# Patient Record
Sex: Female | Born: 1975 | Race: Black or African American | Hispanic: No | Marital: Single | Smoking: Never smoker
Health system: Southern US, Community
[De-identification: ages and names within clinical notes are randomized; demographics above are authoritative.]

## PROBLEM LIST (undated history)

## (undated) DIAGNOSIS — I10 Essential (primary) hypertension: Secondary | ICD-10-CM

## (undated) DIAGNOSIS — D259 Leiomyoma of uterus, unspecified: Secondary | ICD-10-CM

## (undated) DIAGNOSIS — F32A Depression, unspecified: Secondary | ICD-10-CM

## (undated) DIAGNOSIS — F329 Major depressive disorder, single episode, unspecified: Secondary | ICD-10-CM

## (undated) DIAGNOSIS — D649 Anemia, unspecified: Secondary | ICD-10-CM

## (undated) DIAGNOSIS — K219 Gastro-esophageal reflux disease without esophagitis: Secondary | ICD-10-CM

## (undated) HISTORY — PX: ABDOMINAL HYSTERECTOMY: SHX81

---

## 2004-08-06 ENCOUNTER — Emergency Department: Payer: Self-pay | Admitting: Emergency Medicine

## 2007-01-02 HISTORY — PX: TUBAL LIGATION: SHX77

## 2007-05-08 ENCOUNTER — Ambulatory Visit: Payer: Self-pay | Admitting: Family Medicine

## 2007-06-25 ENCOUNTER — Observation Stay: Payer: Self-pay

## 2007-06-26 ENCOUNTER — Encounter: Payer: Self-pay | Admitting: Maternal & Fetal Medicine

## 2007-07-10 ENCOUNTER — Encounter: Payer: Self-pay | Admitting: Family Medicine

## 2007-08-02 ENCOUNTER — Encounter: Payer: Self-pay | Admitting: Family Medicine

## 2007-08-07 ENCOUNTER — Encounter: Payer: Self-pay | Admitting: Maternal & Fetal Medicine

## 2007-08-29 ENCOUNTER — Ambulatory Visit: Payer: Self-pay | Admitting: Family Medicine

## 2007-09-22 ENCOUNTER — Inpatient Hospital Stay: Payer: Self-pay | Admitting: Obstetrics and Gynecology

## 2007-09-22 ENCOUNTER — Observation Stay: Payer: Self-pay | Admitting: Obstetrics and Gynecology

## 2010-08-07 ENCOUNTER — Emergency Department: Payer: Self-pay | Admitting: Emergency Medicine

## 2011-03-08 ENCOUNTER — Ambulatory Visit: Payer: Self-pay | Admitting: Family Medicine

## 2015-02-22 ENCOUNTER — Other Ambulatory Visit: Payer: Self-pay | Admitting: Family Medicine

## 2015-02-22 DIAGNOSIS — D259 Leiomyoma of uterus, unspecified: Secondary | ICD-10-CM

## 2015-02-25 ENCOUNTER — Ambulatory Visit: Payer: Medicaid Other

## 2015-06-24 ENCOUNTER — Other Ambulatory Visit: Payer: Self-pay | Admitting: Family Medicine

## 2015-06-24 DIAGNOSIS — D259 Leiomyoma of uterus, unspecified: Secondary | ICD-10-CM

## 2015-06-29 ENCOUNTER — Ambulatory Visit
Admission: RE | Admit: 2015-06-29 | Discharge: 2015-06-29 | Disposition: A | Discharge status: Home / Self Care | Payer: Medicaid Other | Source: Ambulatory Visit | Attending: Family Medicine | Admitting: Family Medicine

## 2015-06-29 DIAGNOSIS — N83202 Unspecified ovarian cyst, left side: Secondary | ICD-10-CM | POA: Diagnosis not present

## 2015-06-29 DIAGNOSIS — N852 Hypertrophy of uterus: Secondary | ICD-10-CM | POA: Diagnosis not present

## 2015-06-29 DIAGNOSIS — D259 Leiomyoma of uterus, unspecified: Secondary | ICD-10-CM | POA: Diagnosis present

## 2015-10-26 ENCOUNTER — Encounter
Admission: RE | Admit: 2015-10-26 | Discharge: 2015-10-26 | Disposition: A | Discharge status: Home / Self Care | Payer: Medicaid Other | Source: Ambulatory Visit | Attending: Obstetrics and Gynecology | Admitting: Obstetrics and Gynecology

## 2015-10-26 DIAGNOSIS — I10 Essential (primary) hypertension: Secondary | ICD-10-CM | POA: Diagnosis not present

## 2015-10-26 DIAGNOSIS — I498 Other specified cardiac arrhythmias: Secondary | ICD-10-CM | POA: Insufficient documentation

## 2015-10-26 DIAGNOSIS — Z01818 Encounter for other preprocedural examination: Secondary | ICD-10-CM | POA: Insufficient documentation

## 2015-10-26 HISTORY — DX: Major depressive disorder, single episode, unspecified: F32.9

## 2015-10-26 HISTORY — DX: Gastro-esophageal reflux disease without esophagitis: K21.9

## 2015-10-26 HISTORY — DX: Essential (primary) hypertension: I10

## 2015-10-26 HISTORY — DX: Anemia, unspecified: D64.9

## 2015-10-26 HISTORY — DX: Depression, unspecified: F32.A

## 2015-10-26 LAB — BASIC METABOLIC PANEL
Anion gap: 7 (ref 5–15)
BUN: 14 mg/dL (ref 6–20)
CO2: 30 mmol/L (ref 22–32)
Calcium: 9.5 mg/dL (ref 8.9–10.3)
Chloride: 102 mmol/L (ref 101–111)
Creatinine, Ser: 0.79 mg/dL (ref 0.44–1.00)
GFR calc Af Amer: >60 mL/min (ref >60)
GFR calc non Af Amer: >60 mL/min (ref >60)
Glucose, Bld: 106 mg/dL — ABNORMAL HIGH (ref 65–99)
Potassium: 3.1 mmol/L — ABNORMAL LOW (ref 3.5–5.1)
Sodium: 139 mmol/L (ref 135–145)

## 2015-10-26 LAB — CBC
HCT: 33.7 % — ABNORMAL LOW (ref 35.0–47.0)
Hemoglobin: 11.5 g/dL — ABNORMAL LOW (ref 12.0–16.0)
MCH: 28.1 pg (ref 26.0–34.0)
MCHC: 34.1 g/dL (ref 32.0–36.0)
MCV: 82.3 fL (ref 80.0–100.0)
Platelets: 284 10*3/uL (ref 150–440)
RBC: 4.1 MIL/uL (ref 3.80–5.20)
RDW: 16.9 % — ABNORMAL HIGH (ref 11.5–14.5)
WBC: 6 10*3/uL (ref 3.6–11.0)

## 2015-10-26 LAB — TYPE AND SCREEN
ABO/RH(D): B POS
Antibody Screen: NEGATIVE

## 2015-10-26 NOTE — Patient Instructions (Signed)
  Your procedure is scheduled on: Thursday Nov. 2, 2017. Report to Same Day Surgery. To find out your arrival time please call [phone] between 1PM - 3PM on Wednesday Nov. 1, 2017.  Remember: Instructions that are not followed completely may result in serious medical risk, up to and including death, or upon the discretion of your surgeon and anesthesiologist your surgery may need to be rescheduled.    _x___ 1. Do not eat food or drink liquids after midnight. No gum chewing or hard candies.     ____ 2. No Alcohol for 24 hours before or after surgery.   ____ 3. Bring all medications with you on the day of surgery if instructed.    __x__ 4. Notify your doctor if there is any change in your medical condition     (cold, fever, infections).    _____ 5. No smoking 24 hours prior to surgery.     Do not wear jewelry, make-up, hairpins, clips or nail polish.  Do not wear lotions, powders, or perfumes.   Do not shave 48 hours prior to surgery. Men may shave face and neck.  Do not bring valuables to the hospital.    [facility] is not responsible for any belongings or valuables.               Contacts, dentures or bridgework may not be worn into surgery.  Leave your suitcase in the car. After surgery it may be brought to your room.  For patients admitted to the hospital, discharge time is determined by your treatment team.   Patients discharged the day of surgery will not be allowed to drive home.    Please read over the following fact sheets that you were given:   [facility] Preparing for Surgery  _x___ Take these medicines the morning of surgery with A SIP OF WATER:    1. FLUoxetine (PROZAC)    ____ Fleet Enema (as directed)   __x__ Use CHG Soap as directed on instruction sheet  ____ Use inhalers on the day of surgery and bring to hospital day of surgery  ____ Stop metformin 2 days prior to surgery    ____ Take 1/2 of usual insulin dose the night before surgery and none on  the morning of surgery.   ____ Stop Coumadin/Plavix/aspirin on does not apply.  _x___ Stop Anti-inflammatories such as Advil, Aleve, Ibuprofen, Motrin, Naproxen, Naprosyn, Goodies powders or aspirin products. OK to take Tylenol.   ____ Stop supplements until after surgery.    ____ Bring C-Pap to the hospital.

## 2015-10-26 NOTE — Pre-Procedure Instructions (Signed)
Met B results faxed to Dr. Georgianne Fick for review and treatment as indicated, may need to increase potassium supplement. Will recheck potassium level day of surgery.

## 2015-11-02 MED ORDER — CEFAZOLIN SODIUM-DEXTROSE 2-4 GM/100ML-% IV SOLN: 2.0000 g | Freq: Once | INTRAVENOUS | Status: DC

## 2015-11-03 ENCOUNTER — Ambulatory Visit
Admission: RE | Admit: 2015-11-03 | Payer: Medicaid Other | Source: Ambulatory Visit | Admitting: Obstetrics and Gynecology

## 2015-11-03 ENCOUNTER — Encounter: Admission: RE | Payer: Self-pay | Source: Ambulatory Visit

## 2015-11-03 SURGERY — HYSTERECTOMY, TOTAL, LAPAROSCOPIC
Anesthesia: Choice

## 2015-11-03 MED ORDER — BUPIVACAINE HCL (PF) 0.5 % IJ SOLN
INTRAMUSCULAR | Status: AC
  Filled 2015-11-03: qty 30

## 2016-02-13 ENCOUNTER — Emergency Department
Admission: EM | Admit: 2016-02-13 | Discharge: 2016-02-13 | Disposition: A | Discharge status: Home / Self Care | Payer: Medicaid Other | Attending: Emergency Medicine | Admitting: Emergency Medicine

## 2016-02-13 ENCOUNTER — Encounter: Payer: Self-pay | Admitting: Emergency Medicine

## 2016-02-13 DIAGNOSIS — Z79899 Other long term (current) drug therapy: Secondary | ICD-10-CM | POA: Insufficient documentation

## 2016-02-13 DIAGNOSIS — I10 Essential (primary) hypertension: Secondary | ICD-10-CM | POA: Insufficient documentation

## 2016-02-13 DIAGNOSIS — K0889 Other specified disorders of teeth and supporting structures: Secondary | ICD-10-CM | POA: Insufficient documentation

## 2016-02-13 MED ORDER — IBUPROFEN 600 MG PO TABS: 600.0000 mg | ORAL_TABLET | Freq: Three times a day (TID) | ORAL | 0 refills | Status: DC | PRN

## 2016-02-13 MED ORDER — AMOXICILLIN 500 MG PO CAPS: 500.0000 mg | ORAL_CAPSULE | Freq: Three times a day (TID) | ORAL | 0 refills | Status: DC

## 2016-02-13 NOTE — ED Notes (Signed)
See triage note  States she has had tooth pain for about 2-3 months  Pain increased with some swelling last pm

## 2016-02-13 NOTE — Discharge Instructions (Signed)
OPTIONS FOR DENTAL FOLLOW UP CARE ° °[place] Department of Health and Human Services - Local Safety Net Dental Clinics °http://www.ncdhhs.gov/dph/oralhealth/services/safetynetclinics.htm °  °[facility] (336-562-3123) ° °[facility] [place] (919-933-9087) ° °[facility] (919-663-1744 ext [phone]) ° °[facility] ([phone]) ° °[facility] ([phone]) °This clinic caters to the indigent population and is on a lottery system. °Location: °[facility], [address], [place] °Clinic Hours: °Wednesdays from 6pm - 9pm, patients seen by a lottery system. °For dates, call or go to www.med.[facility].edu/[facility]/patients/Dental-[facility] °Services: °Cleanings, fillings and simple extractions. °Payment Options: °DENTAL WORK IS FREE OF CHARGE. Bring proof of income or support. °Best way to get seen: °Arrive at 5:15 pm - this is a lottery, NOT first come/first serve, so arriving earlier will not increase your chances of being seen. °  °  °[facility] °919-537-3737 °Select option 1 for emergencies °  °Location: °[facility], [address], [place] °Clinic Hours: °No walk-ins accepted - call the day before to schedule an appointment. °Check in times are 9:30 am and 1:30 pm. °Services: °Simple extractions, temporary fillings, pulpectomy/pulp debridement, uncomplicated abscess drainage. °Payment Options: °PAYMENT IS DUE AT THE TIME OF SERVICE.  Fee is usually $100-200, additional surgical procedures (e.g. abscess drainage) may be extra. °Cash, checks, Visa/MasterCard accepted.  Can file Medicaid if patient is covered for dental - patient should call case worker to check. °No discount for [facility] patients. °Best way to get seen: °MUST call the day before and get onto the schedule. Can usually be seen the next 1-2 days. No walk-ins accepted. °  °  °[facility] °[phone] °   °Location: °[facility], [address], [place] °Clinic Hours: °M, W, Th, F 8am or 1:30pm, Tues 9a or 1:30 - first come/first served. °Services: °Simple extractions, temporary fillings, uncomplicated abscess drainage.  You do not need to be an [place] resident. °Payment Options: °PAYMENT IS DUE AT THE TIME OF SERVICE. °Dental insurance, otherwise sliding scale - bring proof of income or support. °Depending on income and treatment needed, cost is usually $50-200. °Best way to get seen: °Arrive early as it is first come/first served. °  °  °[facility] °[phone] °  °Location: °[address] °Clinic Hours: °Mon-Thu 8a-5p °Services: °Most basic dental services including extractions and fillings. °Payment Options: °PAYMENT IS DUE AT THE TIME OF SERVICE. °Sliding scale, up to 50% off - bring proof if income or support. °Medicaid with dental option accepted. °Best way to get seen: °Call to schedule an appointment, can usually be seen within 2 weeks OR they will try to see walk-ins - show up at 8a or 2p (you may have to wait). °  °  °[facility] °919-245-2435 °[place] RESIDENTS ONLY °  °Location: °[facility], [address], [place], [place] [address] °Clinic Hours: By appointment only. °Monday - Thursday 8am-5pm, Friday 8am-12pm °Services: Cleanings, fillings, extractions. °Payment Options: °PAYMENT IS DUE AT THE TIME OF SERVICE. °Cash, Visa or MasterCard. Sliding scale - $30 minimum per service. °Best way to get seen: °Come in to office, complete packet and make an appointment - need proof of income °or support monies for each household member and proof of [place] residence. °Usually takes about a month to get in. °  °  °[facility] °[phone] °  °Location: °[address]   [place] °Clinic Hours: Walk-in Urgent Care Dental Services are offered Monday-Friday  mornings only. °The numbers of emergencies accepted daily is limited to the number of °providers available. °Maximum 15 - Mondays, Wednesdays & Thursdays °Maximum 10 - Tuesdays & Fridays °Services: °You do not need to be a [place] resident to be seen for a dental emergency. °Emergencies are defined as pain, swelling, abnormal bleeding, or dental trauma. Walkins will receive x-rays if needed. °NOTE: Dental cleaning is not an emergency. °Payment Options: °PAYMENT IS DUE AT THE TIME OF SERVICE. °Minimum co-pay is $40.00 for uninsured patients. °Minimum co-pay is $3.00 for Medicaid with dental coverage. °Dental Insurance is accepted and must be presented at time of visit. °Medicare does not cover dental. °Forms of payment: Cash, credit card, checks. °Best way to get seen: °If not previously registered with the clinic, walk-in dental registration begins at 7:15 am and is on a first come/first serve basis. °If previously registered with the clinic, call to make an appointment. °  °  °[facility] °919-776-4359 °[place] RESIDENTS ONLY °  °Location: °[address], [place], [place] °Clinic Hours: °Mon-Thu 10a-2p °Services: Extractions only! °Payment Options: °FREE (donations accepted) - bring proof of income or support °Best way to get seen: °Call and schedule an appointment OR come at 8am on the 1st Monday of every month (except for holidays) when it is first come/first served. °  °  °[facility] °[phone] °  °Location: °[address], [place] °Clinic Hours: °Friday mornings °Services, Payment Options, Best way to get seen: °Call for info °

## 2016-02-13 NOTE — ED Triage Notes (Signed)
Pt reports dental pain x3 months, reports pain got worse last night.

## 2016-02-13 NOTE — ED Provider Notes (Signed)
[facility] Emergency Department Provider Note  ____________________________________________   First MD Initiated Contact with Patient 02/13/16 [phone]     (approximate)  I have reviewed the triage vital signs and the nursing notes.   HISTORY  Chief Complaint Dental Pain   HPI Christie Alexander is a 41 y.o. female initial complaint of dental pain for 3 months. Patient states that she has not seen anyone for this during that time. She states that since last evening that the pain has gotten worse. Is unaware of any fever or chills. She denies any recent injury to her teeth. She rates her pain as a 10 over 10.   Past Medical History:  Diagnosis Date  . Anemia   . Depression   . GERD (gastroesophageal reflux disease)    on occasion  . Hypertension     There are no active problems to display for this patient.   Past Surgical History:  Procedure Laterality Date  . TUBAL LIGATION  2009    Prior to Admission medications   Medication Sig Start Date End Date Taking? Authorizing Provider  amoxicillin (AMOXIL) 500 MG capsule Take 1 capsule (500 mg total) by mouth 3 (three) times daily. 02/13/16   Johnn Hai, PA-C  cetirizine (ZYRTEC) 10 MG tablet Take 10 mg by mouth daily.     Historical Provider, MD  ferrous sulfate 325 (65 FE) MG EC tablet Take 325 mg by mouth daily with breakfast.    Historical Provider, MD  FLUoxetine (PROZAC) 10 MG capsule Take 10 mg by mouth daily. in am    Historical Provider, MD  hydrochlorothiazide (HYDRODIURIL) 12.5 MG tablet Take 12.5 mg by mouth daily.    Historical Provider, MD  ibuprofen (ADVIL,MOTRIN) 600 MG tablet Take 1 tablet (600 mg total) by mouth every 8 (eight) hours as needed. 02/13/16   Johnn Hai, PA-C  Potassium 95 MG TABS Take 1 tablet by mouth every morning.    Historical Provider, MD    Allergies Orange (diagnostic) and Tomato  No family history on file.  Social History Social History  Substance  Use Topics  . Smoking status: Never Smoker  . Smokeless tobacco: Never Used  . Alcohol use No    Review of Systems Constitutional: No fever/chills Eyes: No visual changes. ENT: Positive dental pain. Cardiovascular: Denies chest pain. Respiratory: Denies shortness of breath.  Gastrointestinal: No abdominal pain.  No nausea, no vomiting.  Musculoskeletal: Negative for back pain. Skin: Negative for rash. Neurological: Negative for headaches, focal weakness or numbness.  10-point ROS otherwise negative.  ____________________________________________   PHYSICAL EXAM:  VITAL SIGNS: ED Triage Vitals  Enc Vitals Group     BP 02/13/16 0848 (!) 156/94     Pulse Rate 02/13/16 0848 88     Resp 02/13/16 0848 20     Temp 02/13/16 0848 98.7 F (37.1 C)     Temp Source 02/13/16 0848 Oral     SpO2 02/13/16 0848 99 %     Weight 02/13/16 0848 180 lb (81.6 kg)     Height 02/13/16 0848 4\' 11"  (1.499 m)     Head Circumference --      Peak Flow --      Pain Score 02/13/16 0840 10     Pain Loc --      Pain Edu? --      Excl. in [place]? --     Constitutional: Alert and oriented. Well appearing and in no acute distress. Eyes: Conjunctivae  are normal. PERRL. EOMI. Head: Atraumatic. Nose: No congestion/rhinnorhea. Mouth/Throat: Mucous membranes are moist.  Oropharynx non-erythematous. Gums around the right upper posterior molars are tender to palpation with a tongue depressor. No obvious abscess was seen. No drainage. Neck: No stridor.   Hematological/Lymphatic/Immunilogical: No cervical lymphadenopathy. Cardiovascular: Normal rate, regular rhythm. Grossly normal heart sounds.  Good peripheral circulation. Respiratory: Normal respiratory effort.  No retractions. Lungs CTAB. Musculoskeletal: No lower extremity tenderness nor edema.  No joint effusions. Neurologic:  Normal speech and language. No gross focal neurologic deficits are appreciated. No gait instability. Skin:  Skin is warm, dry and  intact. No rash noted. Psychiatric: Mood and affect are normal. Speech and behavior are normal.  ____________________________________________   LABS (all labs ordered are listed, but only abnormal results are displayed)  Labs Reviewed - No data to display   PROCEDURES  Procedure(s) performed: None  Procedures  Critical Care performed: No  ____________________________________________   INITIAL IMPRESSION / ASSESSMENT AND PLAN / ED COURSE  Pertinent labs & imaging results that were available during my care of the patient were reviewed by me and considered in my medical decision making (see chart for details).  Patient was given list of dental clinics that charge per sliding scale. Patient is encouraged to call making (with one of these clinics. She was started on amoxicillin 500 mg 3 times a day for 10 days and ibuprofen 600 mg 4 times a day as needed for pain.   ____________________________________________   FINAL CLINICAL IMPRESSION(S) / ED DIAGNOSES  Final diagnoses:  Pain, dental      NEW MEDICATIONS STARTED DURING THIS VISIT:  Discharge Medication List as of 02/13/2016  9:23 AM    START taking these medications   Details  amoxicillin (AMOXIL) 500 MG capsule Take 1 capsule (500 mg total) by mouth 3 (three) times daily., Starting Mon 02/13/2016, Print         Note:  This document was prepared using Dragon voice recognition software and may include unintentional dictation errors.    Johnn Hai, PA-C 02/13/16 [place], MD 02/13/16 1535

## 2016-06-25 ENCOUNTER — Emergency Department
Admission: EM | Admit: 2016-06-25 | Discharge: 2016-06-25 | Disposition: A | Discharge status: Home / Self Care | Payer: No Typology Code available for payment source | Attending: Emergency Medicine | Admitting: Emergency Medicine

## 2016-06-25 ENCOUNTER — Encounter: Payer: Self-pay | Admitting: Emergency Medicine

## 2016-06-25 DIAGNOSIS — M7918 Myalgia, other site: Secondary | ICD-10-CM

## 2016-06-25 DIAGNOSIS — I10 Essential (primary) hypertension: Secondary | ICD-10-CM | POA: Insufficient documentation

## 2016-06-25 DIAGNOSIS — M791 Myalgia: Secondary | ICD-10-CM | POA: Diagnosis not present

## 2016-06-25 DIAGNOSIS — M545 Low back pain: Secondary | ICD-10-CM | POA: Diagnosis present

## 2016-06-25 DIAGNOSIS — Z79899 Other long term (current) drug therapy: Secondary | ICD-10-CM | POA: Insufficient documentation

## 2016-06-25 MED ORDER — CYCLOBENZAPRINE HCL 10 MG PO TABS: 10.0000 mg | ORAL_TABLET | Freq: Three times a day (TID) | ORAL | 0 refills | Status: DC | PRN

## 2016-06-25 MED ORDER — IBUPROFEN 600 MG PO TABS: 600.0000 mg | ORAL_TABLET | Freq: Three times a day (TID) | ORAL | 0 refills | Status: DC | PRN

## 2016-06-25 MED ORDER — OXYCODONE-ACETAMINOPHEN 7.5-325 MG PO TABS: 1.0000 | ORAL_TABLET | Freq: Four times a day (QID) | ORAL | 0 refills | Status: DC | PRN
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
## 2016-06-25 NOTE — Discharge Instructions (Signed)
Take medication as directed.
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
## 2017-02-07 ENCOUNTER — Encounter: Payer: Self-pay | Admitting: Emergency Medicine

## 2017-02-07 ENCOUNTER — Emergency Department
Admission: EM | Admit: 2017-02-07 | Discharge: 2017-02-07 | Disposition: A | Discharge status: Home / Self Care | Payer: Self-pay | Attending: Emergency Medicine | Admitting: Emergency Medicine

## 2017-02-07 ENCOUNTER — Other Ambulatory Visit: Payer: Self-pay

## 2017-02-07 DIAGNOSIS — M7918 Myalgia, other site: Secondary | ICD-10-CM | POA: Insufficient documentation

## 2017-02-07 DIAGNOSIS — Z79899 Other long term (current) drug therapy: Secondary | ICD-10-CM | POA: Insufficient documentation

## 2017-02-07 DIAGNOSIS — M791 Myalgia, unspecified site: Secondary | ICD-10-CM

## 2017-02-07 DIAGNOSIS — I10 Essential (primary) hypertension: Secondary | ICD-10-CM | POA: Insufficient documentation

## 2017-02-07 MED ORDER — CYCLOBENZAPRINE HCL 5 MG PO TABS: ORAL_TABLET | ORAL | 0 refills | Status: DC

## 2017-02-07 MED ORDER — NAPROXEN 500 MG PO TABS: 500.0000 mg | ORAL_TABLET | Freq: Two times a day (BID) | ORAL | 0 refills | Status: AC

## 2017-02-07 NOTE — ED Triage Notes (Signed)
Presents with left arm pain  States pain started about 3 weeks ago  Denies any injury no deformity noted

## 2017-02-07 NOTE — ED Provider Notes (Signed)
[facility] Emergency Department Provider Note  ____________________________________________  Time seen: Approximately 10:04 AM  I have reviewed the triage vital signs and the nursing notes.   HISTORY  Chief Complaint Arm Pain    HPI Christie Alexander is a 42 y.o. female that presents emergency department for evaluation of left upper arm pain for 3 weeks.  Pain is worse with movement and with working. Pain does not radiate.  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 No injury to arm.  Patient is not on any form of birth control.  She does not smoke.  No recent surgeries.  No history of DVT. Pain has not changed in character in the last 3 weeks.  She came to the emergency department today because she does not feel like she can go to work today or tomorrow. She denies neck pain, weakness, numbness, tingling.   Past Medical History:  Diagnosis Date  . Anemia   . Depression   . GERD (gastroesophageal reflux disease)    on occasion  . Hypertension     There are no active problems to display for this patient.   Past Surgical History:  Procedure Laterality Date  . TUBAL LIGATION  2009    Prior to Admission medications   Medication Sig Start Date End Date Taking? Authorizing Provider  amoxicillin (AMOXIL) 500 MG capsule Take 1 capsule (500 mg total) by mouth 3 (three) times daily. 02/13/16   Johnn Hai, PA-C  cetirizine (ZYRTEC) 10 MG tablet Take 10 mg by mouth daily.     [provider]  cyclobenzaprine (FLEXERIL) 5 MG tablet Take 1-2 tablets 3 times daily as needed 02/07/17   Laban Emperor, PA-C  ferrous sulfate 325 (65 FE) MG EC tablet Take 325 mg by mouth daily with breakfast.    [provider]  FLUoxetine (PROZAC) 10 MG capsule Take 10 mg by mouth daily. in am    [provider]  hydrochlorothiazide (HYDRODIURIL) 12.5 MG tablet Take 12.5 mg by mouth daily.    [provider]  ibuprofen (ADVIL,MOTRIN) 600 MG  tablet Take 1 tablet (600 mg total) by mouth every 8 (eight) hours as needed. 02/13/16   Johnn Hai, PA-C  ibuprofen (ADVIL,MOTRIN) 600 MG tablet Take 1 tablet (600 mg total) by mouth every 8 (eight) hours as needed. 06/25/16   Sable Feil, PA-C  naproxen (NAPROSYN) 500 MG tablet Take 1 tablet (500 mg total) by mouth 2 (two) times daily with a meal. 02/07/17 02/07/18  Laban Emperor, PA-C  oxyCODONE-acetaminophen (PERCOCET) 7.5-325 MG tablet Take 1 tablet by mouth every 6 (six) hours as needed for severe pain. 06/25/16   Sable Feil, PA-C  Potassium 95 MG TABS Take 1 tablet by mouth every morning.    [provider]    Allergies Orange (diagnostic) and Tomato  No family history on file.  Social History Social History   Tobacco Use  . Smoking status: Never Smoker  . Smokeless tobacco: Never Used  Substance Use Topics  . Alcohol use: No  . Drug use: No     Review of Systems  Constitutional: No fever/chills Cardiovascular: No chest pain. Respiratory: No SOB. Gastrointestinal: No abdominal pain.  No nausea, no vomiting.  Musculoskeletal: Positive for arm pain. Skin: Negative for rash, abrasions, lacerations, ecchymosis. Neurological: Negative for numbness or tingling   ____________________________________________   PHYSICAL EXAM:  VITAL SIGNS: ED Triage Vitals  Enc Vitals Group  BP 02/07/17 0806 133/71     Pulse Rate 02/07/17 0806 89     Resp 02/07/17 0806 20     Temp 02/07/17 0806 98.7 F (37.1 C)     Temp Source 02/07/17 0806 Oral     SpO2 02/07/17 0806 98 %     Weight 02/07/17 0804 185 lb (83.9 kg)     Height 02/07/17 0804 4\' 11"  (1.499 m)     Head Circumference --      Peak Flow --      Pain Score 02/07/17 0804 8     Pain Loc --      Pain Edu? --      Excl. in [place]? --      Constitutional: Alert and oriented. Well appearing and in no acute distress. Eyes: Conjunctivae are normal. PERRL. EOMI. Head: Atraumatic. ENT:      Ears:       Nose: No congestion/rhinnorhea.      Mouth/Throat: Mucous membranes are moist.  Neck: No stridor.   Cardiovascular: Normal rate, regular rhythm.  Good peripheral circulation.  Symmetric radial pulses bilaterally. Respiratory: Normal respiratory effort without tachypnea or retractions. Lungs CTAB. Good air entry to the bases with no decreased or absent breath sounds. Gastrointestinal: Bowel sounds 4 quadrants. Soft and nontender to palpation. No guarding or rigidity. No palpable masses. No distention.  Musculoskeletal: Full range of motion to all extremities. No gross deformities appreciated.  Pain with resisted extension of elbow.  No pain with resisted flexion of elbow.  Strength 5 out of 5 in upper extremities bilaterally.  Minimal diffuse tenderness to palpation over biceps and triceps.  No pinpoint tenderness.  No areas of warmth or erythema. Neurologic:  Normal speech and language. No gross focal neurologic deficits are appreciated.  Skin:  Skin is warm, dry and intact. No rash noted.   ____________________________________________   LABS (all labs ordered are listed, but only abnormal results are displayed)  Labs Reviewed - No data to display ____________________________________________  EKG   ____________________________________________  RADIOLOGY  No results found.  ____________________________________________    PROCEDURES  Procedure(s) performed:    Procedures    Medications - No data to display   ____________________________________________   INITIAL IMPRESSION / ASSESSMENT AND PLAN / ED COURSE  Pertinent labs & imaging results that were available during my care of the patient were reviewed by me and considered in my medical decision making (see chart for details).  Review of the [place] CSRS was performed in accordance of the [place] prior to dispensing any controlled drugs.   Patient's diagnosis is consistent with musculoskeletal pain.  Pain is likely from  increased use at work.  Patient will be discharged home with prescriptions for naproxen and Flexeril. Patient is to follow up with PCP as directed. Patient is given ED precautions to return to the ED for any worsening or new symptoms.     ____________________________________________  FINAL CLINICAL IMPRESSION(S) / ED DIAGNOSES  Final diagnoses:  Muscle pain      NEW MEDICATIONS STARTED DURING THIS VISIT:  ED Discharge Orders        Ordered    naproxen (NAPROSYN) 500 MG tablet  2 times daily with meals     02/07/17 1029    cyclobenzaprine (FLEXERIL) 5 MG tablet     02/07/17 1029          This chart was dictated using voice recognition software/Dragon. Despite best efforts to proofread, errors can occur which can change the meaning.  Any change was purely unintentional.    Laban Emperor, PA-C 02/07/17 1538    Nance Pear, MD 02/08/17 [phone]

## 2017-02-07 NOTE — ED Notes (Addendum)
FIRST NURSE NOTE: pt c/o left arm pain starting in mid upper arm and through left elbow X 3 weeks. Pt denies any known injury, but does use the arm a lot at work. Pt alert and oriented X4, active, cooperative, pt in NAD. RR even and unlabored, color WNL.

## 2017-02-27 ENCOUNTER — Emergency Department
Admission: EM | Admit: 2017-02-27 | Discharge: 2017-02-27 | Disposition: A | Discharge status: Home / Self Care | Payer: Self-pay | Attending: Emergency Medicine | Admitting: Emergency Medicine

## 2017-02-27 ENCOUNTER — Encounter: Payer: Self-pay | Admitting: Emergency Medicine

## 2017-02-27 ENCOUNTER — Emergency Department: Payer: Self-pay

## 2017-02-27 DIAGNOSIS — M79602 Pain in left arm: Secondary | ICD-10-CM | POA: Insufficient documentation

## 2017-02-27 DIAGNOSIS — I10 Essential (primary) hypertension: Secondary | ICD-10-CM | POA: Insufficient documentation

## 2017-02-27 DIAGNOSIS — Z79899 Other long term (current) drug therapy: Secondary | ICD-10-CM | POA: Insufficient documentation

## 2017-02-27 MED ORDER — TRAMADOL HCL 50 MG PO TABS: 50.0000 mg | ORAL_TABLET | Freq: Four times a day (QID) | ORAL | 0 refills | Status: DC | PRN

## 2017-02-27 MED ORDER — NAPROXEN 500 MG PO TABS: 500.0000 mg | ORAL_TABLET | Freq: Two times a day (BID) | ORAL | Status: DC

## 2017-02-27 NOTE — ED Notes (Signed)
First Nurse Note:  Patient complains of left arm pain with movement X 1 month.

## 2017-02-27 NOTE — ED Notes (Signed)
Pt ambulatory without difficulty. VSS. NAD. Discharge instructions, RX, and follow up discussed.All qestions answered.

## 2017-02-27 NOTE — ED Triage Notes (Signed)
Patient presents to ED via POV from home with c/o left arm pain x 1 month. Patient reports she has been seen here before for same and told she has a pulled muscle. Patient states "the pain medicine is not working". Denies recent injury or trauma.

## 2017-02-27 NOTE — ED Provider Notes (Signed)
[facility] Emergency Department Provider Note   ____________________________________________   First MD Initiated Contact with Patient 02/27/17 [phone]     (approximate)  I have reviewed the triage vital signs and the nursing notes.   HISTORY  Chief Complaint Arm Pain    HPI Christie Alexander is a 42 y.o. female patient complain of right arm pain for 1 month.  Patient was seen earlier this morning with diagnosis of muscle strain.  Patient state no improvement with muscle relaxers and pain medication.  Patient denies injury or trauma.  Patient is right-hand dominant.  Patient the pain increased with flexion extension and overhead reaching.  Patient points to the mid humerus and elbow as a source of pain.  Patient rates the pain as a 9/10.  Patient described the pain is "achy".  No palates measured for complaint.  Past Medical History:  Diagnosis Date  . Anemia   . Depression   . GERD (gastroesophageal reflux disease)    on occasion  . Hypertension     There are no active problems to display for this patient.   Past Surgical History:  Procedure Laterality Date  . TUBAL LIGATION  2009    Prior to Admission medications   Medication Sig Start Date End Date Taking? Authorizing Provider  amoxicillin (AMOXIL) 500 MG capsule Take 1 capsule (500 mg total) by mouth 3 (three) times daily. 02/13/16   Johnn Hai, PA-C  cetirizine (ZYRTEC) 10 MG tablet Take 10 mg by mouth daily.     [provider]  cyclobenzaprine (FLEXERIL) 5 MG tablet Take 1-2 tablets 3 times daily as needed 02/07/17   Laban Emperor, PA-C  ferrous sulfate 325 (65 FE) MG EC tablet Take 325 mg by mouth daily with breakfast.    [provider]  FLUoxetine (PROZAC) 10 MG capsule Take 10 mg by mouth daily. in am    [provider]  hydrochlorothiazide (HYDRODIURIL) 12.5 MG tablet Take 12.5 mg by mouth daily.    [provider]  ibuprofen (ADVIL,MOTRIN) 600 MG  tablet Take 1 tablet (600 mg total) by mouth every 8 (eight) hours as needed. 02/13/16   Johnn Hai, PA-C  ibuprofen (ADVIL,MOTRIN) 600 MG tablet Take 1 tablet (600 mg total) by mouth every 8 (eight) hours as needed. 06/25/16   Sable Feil, PA-C  naproxen (NAPROSYN) 500 MG tablet Take 1 tablet (500 mg total) by mouth 2 (two) times daily with a meal. 02/07/17 02/07/18  Laban Emperor, PA-C  naproxen (NAPROSYN) 500 MG tablet Take 1 tablet (500 mg total) by mouth 2 (two) times daily with a meal. 02/27/17   Sable Feil, PA-C  oxyCODONE-acetaminophen (PERCOCET) 7.5-325 MG tablet Take 1 tablet by mouth every 6 (six) hours as needed for severe pain. 06/25/16   Sable Feil, PA-C  Potassium 95 MG TABS Take 1 tablet by mouth every morning.    [provider]  traMADol (ULTRAM) 50 MG tablet Take 1 tablet (50 mg total) by mouth every 6 (six) hours as needed for moderate pain. 02/27/17   Sable Feil, PA-C    Allergies Orange (diagnostic) and Tomato  No family history on file.  Social History Social History   Tobacco Use  . Smoking status: Never Smoker  . Smokeless tobacco: Never Used  Substance Use Topics  . Alcohol use: No  . Drug use: No    Review of Systems  Constitutional: No fever/chills Eyes: No visual changes. ENT: No sore  throat. Cardiovascular: Denies chest pain. Respiratory: Denies shortness of breath. Gastrointestinal: No abdominal pain.  No nausea, no vomiting.  No diarrhea.  No constipation. Genitourinary: Negative for dysuria. Musculoskeletal: Left shoulder pain Skin: Negative for rash. Neurological: Negative for headaches, focal weakness or numbness. Psychiatric:Depression Endocrine:Hypertension Hematological/Lymphatic:Anemia Allergic/Immunilogical: Oranges and tomatoes. ____________________________________________   PHYSICAL EXAM:  VITAL SIGNS: ED Triage Vitals [02/27/17 0902]  Enc Vitals Group     BP (!) 144/86     Pulse Rate 90      Resp 15     Temp 98.3 F (36.8 C)     Temp Source Oral     SpO2 98 %     Weight 180 lb (81.6 kg)     Height 4\' 11"  (1.499 m)     Head Circumference      Peak Flow      Pain Score 9     Pain Loc      Pain Edu?      Excl. in [place]?     Constitutional: Alert and oriented. Well appearing and in no acute distress. Neck:   No cervical spine tenderness to palpation. Cardiovascular: Normal rate, regular rhythm. Grossly normal heart sounds.  Good peripheral circulation. Respiratory: Normal respiratory effort.  No retractions. Lungs CTAB. Musculoskeletal: No obvious deformity to the left shoulder.  Decreased range of motion extension of the forearm.  Decreased range of motion with abduction and overhead reaching of the left shoulder. Neurologic:  Normal speech and language. No gross focal neurologic deficits are appreciated. No gait instability. Skin:  Skin is warm, dry and intact. No rash noted. Psychiatric: Mood and affect are normal. Speech and behavior are normal.  ____________________________________________   LABS (all labs ordered are listed, but only abnormal results are displayed)  Labs Reviewed - No data to display ____________________________________________  EKG   ____________________________________________  RADIOLOGY  ED MD interpretation: No acute findings x-ray left arm. Official radiology report(s): Dg Humerus Left  Result Date: 02/27/2017 CLINICAL DATA:  Left arm pain for 1 month EXAM: LEFT HUMERUS - 2+ VIEW COMPARISON:  None. FINDINGS: There is no evidence of fracture or other focal bone lesions. Soft tissues are unremarkable. IMPRESSION: No acute osseous injury of the left humerus. Electronically Signed   By: Kathreen Devoid   On: 02/27/2017 09:49    ____________________________________________   PROCEDURES  Procedure(s) performed: None  Procedures  Critical Care performed: No  ____________________________________________   INITIAL IMPRESSION /  ASSESSMENT AND PLAN / ED COURSE  As part of my medical decision making, I reviewed the following data within the electronic MEDICAL RECORD NUMBER    Muscle skeletal pain left upper extremity.  Discussed negative x-ray findings with patient.  Patient given discharge care instruction.  Patient advised take medication as directed.  Patient given a work note advised follow-up PCP for continued care.      ____________________________________________   FINAL CLINICAL IMPRESSION(S) / ED DIAGNOSES  Final diagnoses:  Left arm pain     ED Discharge Orders        Ordered    naproxen (NAPROSYN) 500 MG tablet  2 times daily with meals     02/27/17 1119    traMADol (ULTRAM) 50 MG tablet  Every 6 hours PRN     02/27/17 1119       Note:  This document was prepared using Dragon voice recognition software and may include unintentional dictation errors.    Sable Feil, PA-C 02/27/17 1121    Lisa Roca,  MD 02/28/17 [phone]

## 2017-07-14 ENCOUNTER — Emergency Department: Payer: Self-pay

## 2017-07-14 ENCOUNTER — Other Ambulatory Visit: Payer: Self-pay

## 2017-07-14 ENCOUNTER — Emergency Department
Admission: EM | Admit: 2017-07-14 | Discharge: 2017-07-14 | Disposition: A | Discharge status: Home / Self Care | Payer: Self-pay | Attending: Emergency Medicine | Admitting: Emergency Medicine

## 2017-07-14 DIAGNOSIS — I10 Essential (primary) hypertension: Secondary | ICD-10-CM | POA: Insufficient documentation

## 2017-07-14 DIAGNOSIS — D259 Leiomyoma of uterus, unspecified: Secondary | ICD-10-CM | POA: Insufficient documentation

## 2017-07-14 DIAGNOSIS — N921 Excessive and frequent menstruation with irregular cycle: Secondary | ICD-10-CM | POA: Insufficient documentation

## 2017-07-14 DIAGNOSIS — Z3202 Encounter for pregnancy test, result negative: Secondary | ICD-10-CM | POA: Insufficient documentation

## 2017-07-14 DIAGNOSIS — Z79899 Other long term (current) drug therapy: Secondary | ICD-10-CM | POA: Insufficient documentation

## 2017-07-14 HISTORY — DX: Leiomyoma of uterus, unspecified: D25.9

## 2017-07-14 LAB — CBC
HCT: 30.5 % — ABNORMAL LOW (ref 35.0–47.0)
Hemoglobin: 9.9 g/dL — ABNORMAL LOW (ref 12.0–16.0)
MCH: 24.7 pg — ABNORMAL LOW (ref 26.0–34.0)
MCHC: 32.4 g/dL (ref 32.0–36.0)
MCV: 76.1 fL — ABNORMAL LOW (ref 80.0–100.0)
Platelets: 368 10*3/uL (ref 150–440)
RBC: 4.01 MIL/uL (ref 3.80–5.20)
RDW: 15.8 % — ABNORMAL HIGH (ref 11.5–14.5)
WBC: 5.5 10*3/uL (ref 3.6–11.0)

## 2017-07-14 LAB — COMPREHENSIVE METABOLIC PANEL
ALT: 10 U/L (ref 0–44)
AST: 15 U/L (ref 15–41)
Albumin: 3.9 g/dL (ref 3.5–5.0)
Alkaline Phosphatase: 67 U/L (ref 38–126)
Anion gap: 6 (ref 5–15)
BUN: 13 mg/dL (ref 6–20)
CO2: 26 mmol/L (ref 22–32)
Calcium: 9.2 mg/dL (ref 8.9–10.3)
Chloride: 108 mmol/L (ref 98–111)
Creatinine, Ser: 0.76 mg/dL (ref 0.44–1.00)
GFR calc Af Amer: >60 mL/min (ref >60)
GFR calc non Af Amer: >60 mL/min (ref >60)
Glucose, Bld: 98 mg/dL (ref 70–99)
Potassium: 3.7 mmol/L (ref 3.5–5.1)
Sodium: 140 mmol/L (ref 135–145)
Total Bilirubin: 0.4 mg/dL (ref 0.3–1.2)
Total Protein: 7.7 g/dL (ref 6.5–8.1)

## 2017-07-14 LAB — POCT PREGNANCY, URINE: Preg Test, Ur: NEGATIVE

## 2017-07-14 LAB — URINALYSIS, COMPLETE (UACMP) WITH MICROSCOPIC
Bacteria, UA: NONE SEEN
Bilirubin Urine: NEGATIVE
Glucose, UA: NEGATIVE mg/dL
Ketones, ur: NEGATIVE mg/dL
Nitrite: NEGATIVE
Protein, ur: 30 mg/dL — AB
RBC / HPF: >50 RBC/hpf — ABNORMAL HIGH (ref 0–5)
Specific Gravity, Urine: 1.02 (ref 1.005–1.030)
pH: 5 (ref 5.0–8.0)

## 2017-07-14 LAB — LIPASE, BLOOD: Lipase: 36 U/L (ref 11–51)

## 2017-07-14 MED ORDER — IBUPROFEN 800 MG PO TABS
800.0000 mg | ORAL_TABLET | ORAL | Status: AC
  Administered 2017-07-14: 800 mg via ORAL
  Filled 2017-07-14: qty 1

## 2017-07-14 NOTE — ED Notes (Signed)
.   Pt is resting, Respirations even and unlabored, NAD. Stretcher lowest postion and locked. Call bell within reach. Denies any needs at this time RN will continue to monitor.    

## 2017-07-14 NOTE — ED Notes (Signed)
Pt is sitting on edge of bed with family, when RN walked in pt was drinking Coke RN informed her that no more food or drink till EDP can see her.

## 2017-07-14 NOTE — ED Notes (Signed)
Pt complains of abd pain. Pt states menstrual was 6/28 and off and on bleeding since then. Pt states she has a hx of tubal ligation and fibros. Awaiting EDP at this time.

## 2017-07-14 NOTE — ED Triage Notes (Signed)
Pt states period started on the 28th of June, spotted, then started again on Thursday. States abd cramping and states back pain and felt like legs have been swollen. States some days are heavy and some are light. Previous tubal ligation and uterine fibroids.

## 2017-07-14 NOTE — Discharge Instructions (Addendum)
Please follow up closely with obstetrics and gynecology or your primary doctor.  Return to the emergency room if your bleeding worsens, you become weak and dizzy or lightheaded, you have an episode of passing out, develop severe bleeding such as more than 1 soaked pad per hour for more than 3 straight hours, develop abdominal or pelvic pain, fevers chills or other new concerns arise.   

## 2017-07-14 NOTE — ED Provider Notes (Signed)
[facility] Emergency Department Provider Note ____________________________________________   First MD Initiated Contact with Patient 07/14/17 1721     (approximate)  I have reviewed the triage vital signs and the nursing notes.   HISTORY  Chief Complaint Abdominal Pain and Vaginal Bleeding  HPI Christie Alexander is a 42 y.o. female for evaluation of irregular periods and bleeding  Patient reports she had her last period about 28 June, but she then began to start noticing some spotting off and on over the last 2 weeks.  She had a couple of days around 3 to 5 days ago where she had heavier bleeding, sometimes going through up to a pad every few hours, but that seems to have improved now.  Is been associated with an occasional crampy discomfort that she describes like period cramps.  She had a previous tubal ligation.  Patient also reports that she has a history of fibroids and wonders if that might be the cause.  No fevers or chills.  No nausea vomiting.  Denies abdominal pain except reports cramping in the lower abdomen off and on, currently reports a very mild crampy sensation just above the pubic bone.  She has not had any heavy bleeding today reports more of a "spotting"    Past Medical History:  Diagnosis Date  . Anemia   . Depression   . GERD (gastroesophageal reflux disease)    on occasion  . Hypertension   . Uterine fibroid     There are no active problems to display for this patient.   Past Surgical History:  Procedure Laterality Date  . TUBAL LIGATION  2009    Prior to Admission medications   Medication Sig Start Date End Date Taking? Authorizing Provider  amoxicillin (AMOXIL) 500 MG capsule Take 1 capsule (500 mg total) by mouth 3 (three) times daily. 02/13/16   Johnn Hai, PA-C  cetirizine (ZYRTEC) 10 MG tablet Take 10 mg by mouth daily.     [provider]  cyclobenzaprine (FLEXERIL) 5 MG tablet Take 1-2 tablets 3 times  daily as needed 02/07/17   Laban Emperor, PA-C  ferrous sulfate 325 (65 FE) MG EC tablet Take 325 mg by mouth daily with breakfast.    [provider]  FLUoxetine (PROZAC) 10 MG capsule Take 10 mg by mouth daily. in am    [provider]  hydrochlorothiazide (HYDRODIURIL) 12.5 MG tablet Take 12.5 mg by mouth daily.    [provider]  ibuprofen (ADVIL,MOTRIN) 600 MG tablet Take 1 tablet (600 mg total) by mouth every 8 (eight) hours as needed. 02/13/16   Johnn Hai, PA-C  ibuprofen (ADVIL,MOTRIN) 600 MG tablet Take 1 tablet (600 mg total) by mouth every 8 (eight) hours as needed. 06/25/16   Sable Feil, PA-C  naproxen (NAPROSYN) 500 MG tablet Take 1 tablet (500 mg total) by mouth 2 (two) times daily with a meal. 02/07/17 02/07/18  Laban Emperor, PA-C  naproxen (NAPROSYN) 500 MG tablet Take 1 tablet (500 mg total) by mouth 2 (two) times daily with a meal. 02/27/17   Sable Feil, PA-C  oxyCODONE-acetaminophen (PERCOCET) 7.5-325 MG tablet Take 1 tablet by mouth every 6 (six) hours as needed for severe pain. 06/25/16   Sable Feil, PA-C  Potassium 95 MG TABS Take 1 tablet by mouth every morning.    [provider]  traMADol (ULTRAM) 50 MG tablet Take 1 tablet (50 mg total) by mouth every 6 (six) hours as  needed for moderate pain. 02/27/17   Sable Feil, PA-C    Allergies Orange (diagnostic) and Tomato  History reviewed. No pertinent family history.  Social History Social History   Tobacco Use  . Smoking status: Never Smoker  . Smokeless tobacco: Never Used  Substance Use Topics  . Alcohol use: No  . Drug use: No    Review of Systems Constitutional: No fever/chills Eyes: No visual changes. ENT: No sore throat. Cardiovascular: Denies chest pain. Respiratory: Denies shortness of breath. Gastrointestinal: No abdominal pain reports some crampy discomfort in the lower abdomen she describes as her "menstrual cramps".  No nausea, no vomiting.   No diarrhea.  No constipation. Genitourinary: Negative for dysuria.  No vaginal discharge except for spotting.  Some occasional clots last couple days but is now improved.  Denies pregnancy.  Musculoskeletal: Negative for back pain. Skin: Negative for rash. Neurological: Negative for headaches, focal weakness or numbness.    ____________________________________________   PHYSICAL EXAM:  VITAL SIGNS: ED Triage Vitals  Enc Vitals Group     BP 07/14/17 1505 (!) 144/103     Pulse Rate 07/14/17 1505 81     Resp 07/14/17 1505 18     Temp 07/14/17 1505 98.4 F (36.9 C)     Temp Source 07/14/17 1505 Oral     SpO2 07/14/17 1505 100 %     Weight 07/14/17 1506 175 lb (79.4 kg)     Height 07/14/17 1506 4\' 11"  (1.499 m)     Head Circumference --      Peak Flow --      Pain Score 07/14/17 1506 6     Pain Loc --      Pain Edu? --      Excl. in [place]? --     Constitutional: Alert and oriented. Well appearing and in no acute distress. Eyes: Conjunctivae are normal. Head: Atraumatic. Nose: No congestion/rhinnorhea. Mouth/Throat: Mucous membranes are moist. Neck: No stridor.   Cardiovascular: Normal rate, regular rhythm. Grossly normal heart sounds.  Good peripheral circulation. Respiratory: Normal respiratory effort.  No retractions. Lungs CTAB. Gastrointestinal: Soft and nontender there is some minimal discomfort over the suprapubic region without rebound or guarding. No distention. Musculoskeletal: No lower extremity tenderness nor edema. Neurologic:  Normal speech and language. No gross focal neurologic deficits are appreciated.  Skin:  Skin is warm, dry and intact. No rash noted. Psychiatric: Mood and affect are normal. Speech and behavior are normal.  ____________________________________________   LABS (all labs ordered are listed, but only abnormal results are displayed)  Labs Reviewed  CBC - Abnormal; Notable for the following components:      Result Value   Hemoglobin 9.9 (*)     HCT 30.5 (*)    MCV 76.1 (*)    MCH 24.7 (*)    RDW 15.8 (*)    All other components within normal limits  URINALYSIS, COMPLETE (UACMP) WITH MICROSCOPIC - Abnormal; Notable for the following components:   Color, Urine YELLOW (*)    APPearance HAZY (*)    Hgb urine dipstick LARGE (*)    Protein, ur 30 (*)    Leukocytes, UA SMALL (*)    RBC / HPF >50 (*)    All other components within normal limits  LIPASE, BLOOD  COMPREHENSIVE METABOLIC PANEL  POC URINE PREG, ED  POCT PREGNANCY, URINE   ____________________________________________  EKG   ____________________________________________  RADIOLOGY  Ultrasound result demonstrates uterine fibroids. ____________________________________________   PROCEDURES  Procedure(s) performed: None  Procedures  Critical Care performed: No  ____________________________________________   INITIAL IMPRESSION / ASSESSMENT AND PLAN / ED COURSE  Pertinent labs & imaging results that were available during my care of the patient were reviewed by me and considered in my medical decision making (see chart for details).  Patient presents for change in her menstrual cycle, irregularity, and intermittent crampy discomfort.  She had heavier bleeding a few days ago but now describes an ongoing just slight spotting.  She does have a history of fibroids, is not pregnant today, and very reassuring exam.  Her hemoglobin is just slightly reduced from previous check a couple of years ago, but her bleeding does not sound to be extremely heavy or large in volume and in fact is improving today but she continues to have intermittent cramping.  Reassuring clinical examination, no signs or symptoms of infectious etiology.  Will obtain ultrasound to further evaluate, I suspect this may be related to her fibroids.  No evidence or clinical history to suggest torsion, infection.  ----------------------------------------- 7:32 PM on  07/14/2017 -----------------------------------------  Discussed with patient careful return precautions, she will plan to follow-up with OB/GYN. Return precautions and treatment recommendations and follow-up discussed with the patient who is agreeable with the plan.  Resting comfortably reports her pain is improved with ibuprofen.       ____________________________________________   FINAL CLINICAL IMPRESSION(S) / ED DIAGNOSES  Final diagnoses:  Uterine leiomyoma, unspecified location  Menorrhagia with irregular cycle      NEW MEDICATIONS STARTED DURING THIS VISIT:  New Prescriptions   No medications on file     Note:  This document was prepared using Dragon voice recognition software and may include unintentional dictation errors.     Delman Kitten, MD 07/14/17 [phone]

## 2018-02-19 ENCOUNTER — Encounter: Payer: Self-pay | Admitting: Emergency Medicine

## 2018-02-19 ENCOUNTER — Emergency Department: Payer: Medicaid Other

## 2018-02-19 ENCOUNTER — Emergency Department
Admission: EM | Admit: 2018-02-19 | Discharge: 2018-02-19 | Disposition: A | Discharge status: Home / Self Care | Payer: Medicaid Other | Attending: Emergency Medicine | Admitting: Emergency Medicine

## 2018-02-19 DIAGNOSIS — I10 Essential (primary) hypertension: Secondary | ICD-10-CM | POA: Insufficient documentation

## 2018-02-19 DIAGNOSIS — J111 Influenza due to unidentified influenza virus with other respiratory manifestations: Secondary | ICD-10-CM

## 2018-02-19 DIAGNOSIS — R05 Cough: Secondary | ICD-10-CM | POA: Insufficient documentation

## 2018-02-19 DIAGNOSIS — R69 Illness, unspecified: Secondary | ICD-10-CM

## 2018-02-19 DIAGNOSIS — R509 Fever, unspecified: Secondary | ICD-10-CM | POA: Insufficient documentation

## 2018-02-19 DIAGNOSIS — R0981 Nasal congestion: Secondary | ICD-10-CM | POA: Insufficient documentation

## 2018-02-19 DIAGNOSIS — M791 Myalgia, unspecified site: Secondary | ICD-10-CM | POA: Insufficient documentation

## 2018-02-19 LAB — INFLUENZA PANEL BY PCR (TYPE A & B)
Influenza A By PCR: NEGATIVE
Influenza B By PCR: NEGATIVE

## 2018-02-19 MED ORDER — IBUPROFEN 600 MG PO TABS: 600.0000 mg | ORAL_TABLET | Freq: Three times a day (TID) | ORAL | 0 refills | Status: DC | PRN

## 2018-02-19 MED ORDER — PSEUDOEPH-BROMPHEN-DM 30-2-10 MG/5ML PO SYRP: 5.0000 mL | ORAL_SOLUTION | Freq: Four times a day (QID) | ORAL | 0 refills | Status: DC | PRN

## 2018-02-19 NOTE — ED Provider Notes (Signed)
[facility] Emergency Department Provider Note   ____________________________________________   First MD Initiated Contact with Patient 02/19/18 [id number]     (approximate)  I have reviewed the triage vital signs and the nursing notes.   HISTORY  Chief Complaint Influenza; Cough; Fever; and Nasal Congestion    HPI Christie Alexander is a 43 y.o. female   patient presents with 5 days of flulike symptoms consisting of cough, congestion, fever, body aches and nasal congestion.  Patient state husband is affected with same complaints before she started symptoms.  Patient is not taken a flu shot for this season.  Patient denies nausea, vomiting, diarrhea.  Patient rates her pain discomfort as a 9/10.  Patient describes her pain as "achy".  No palliative measures for complaint.   Past Medical History:  Diagnosis Date  . Anemia   . Depression   . GERD (gastroesophageal reflux disease)    on occasion  . Hypertension   . Uterine fibroid     There are no active problems to display for this patient.   Past Surgical History:  Procedure Laterality Date  . TUBAL LIGATION  2009    Prior to Admission medications   Medication Sig Start Date End Date Taking? Authorizing Provider  amoxicillin (AMOXIL) 500 MG capsule Take 1 capsule (500 mg total) by mouth 3 (three) times daily. 02/13/16   Johnn Hai, PA-C  brompheniramine-pseudoephedrine-DM 30-2-10 MG/5ML syrup Take 5 mLs by mouth 4 (four) times daily as needed. 02/19/18   Sable Feil, PA-C  cetirizine (ZYRTEC) 10 MG tablet Take 10 mg by mouth daily.     [provider]  cyclobenzaprine (FLEXERIL) 5 MG tablet Take 1-2 tablets 3 times daily as needed 02/07/17   Laban Emperor, PA-C  ferrous sulfate 325 (65 FE) MG EC tablet Take 325 mg by mouth daily with breakfast.    [provider]  FLUoxetine (PROZAC) 10 MG capsule Take 10 mg by mouth daily. in am    [provider]  hydrochlorothiazide  (HYDRODIURIL) 12.5 MG tablet Take 12.5 mg by mouth daily.    [provider]  ibuprofen (ADVIL,MOTRIN) 600 MG tablet Take 1 tablet (600 mg total) by mouth every 8 (eight) hours as needed. 02/13/16   Johnn Hai, PA-C  ibuprofen (ADVIL,MOTRIN) 600 MG tablet Take 1 tablet (600 mg total) by mouth every 8 (eight) hours as needed. 06/25/16   Sable Feil, PA-C  ibuprofen (ADVIL,MOTRIN) 600 MG tablet Take 1 tablet (600 mg total) by mouth every 8 (eight) hours as needed. 02/19/18   Sable Feil, PA-C  naproxen (NAPROSYN) 500 MG tablet Take 1 tablet (500 mg total) by mouth 2 (two) times daily with a meal. 02/27/17   Sable Feil, PA-C  oxyCODONE-acetaminophen (PERCOCET) 7.5-325 MG tablet Take 1 tablet by mouth every 6 (six) hours as needed for severe pain. 06/25/16   Sable Feil, PA-C  Potassium 95 MG TABS Take 1 tablet by mouth every morning.    [provider]  traMADol (ULTRAM) 50 MG tablet Take 1 tablet (50 mg total) by mouth every 6 (six) hours as needed for moderate pain. 02/27/17   Sable Feil, PA-C    Allergies Orange (diagnostic) and Tomato  No family history on file.  Social History Social History   Tobacco Use  . Smoking status: Never Smoker  . Smokeless tobacco: Never Used  Substance Use Topics  . Alcohol use: No  . Drug use: No  Review of Systems  Constitutional: Fever/chills and body aches. Eyes: No visual changes. ENT: No sore throat.  Nasal congestion. Cardiovascular: Denies chest pain. Respiratory: Denies shortness of breath.  Nonproductive cough. Gastrointestinal: No abdominal pain.  No nausea, no vomiting.  No diarrhea.  No constipation. Genitourinary: Negative for dysuria. Musculoskeletal: Negative for back pain. Skin: Negative for rash. Neurological: Negative for headaches, focal weakness or numbness. Psychiatric: Depression.  Endocrine: Hypertension.  Hematological/Lymphatic:  Allergic/Immunilogical: Tomatoes and  oranges. ____________________________________________   PHYSICAL EXAM:  VITAL SIGNS: ED Triage Vitals  Enc Vitals Group     BP 02/19/18 0741 (!) 146/83     Pulse Rate 02/19/18 0739 97     Resp 02/19/18 0739 18     Temp 02/19/18 0739 99.2 F (37.3 C)     Temp Source 02/19/18 0739 Oral     SpO2 02/19/18 0739 98 %     Weight 02/19/18 0741 230 lb (104.3 kg)     Height 02/19/18 0739 4' 11.5" (1.511 m)     Head Circumference --      Peak Flow --      Pain Score 02/19/18 0741 9     Pain Loc --      Pain Edu? --      Excl. in [place]? --     Constitutional: Alert and oriented. Well appearing and in no acute distress. Eyes: Conjunctivae are normal. PERRL. EOMI. Head: Atraumatic. Nose: Edematous nasal turbinates clear rhinorrhea.. Mouth/Throat: Mucous membranes are moist.  Oropharynx non-erythematous.  Postnasal drainage. Neck: No stridor.   Hematological/Lymphatic/Immunilogical: No cervical lymphadenopathy. Cardiovascular: Normal rate, regular rhythm. Grossly normal heart sounds.  Good peripheral circulation. Respiratory: Normal respiratory effort.  No retractions. Lungs CTAB. Gastrointestinal: Soft and nontender. No distention. No abdominal bruits. No CVA tenderness. Musculoskeletal: No lower extremity tenderness nor edema.  No joint effusions. Neurologic:  Normal speech and language. No gross focal neurologic deficits are appreciated. No gait instability. Skin:  Skin is warm, dry and intact. No rash noted. Psychiatric: Mood and affect are normal. Speech and behavior are normal.  ____________________________________________   LABS (all labs ordered are listed, but only abnormal results are displayed)  Labs Reviewed  INFLUENZA PANEL BY PCR (TYPE A & B)   ____________________________________________  EKG   ____________________________________________  RADIOLOGY  ED MD interpretation:    Official radiology report(s): Dg Chest 2 View  Result Date: 02/19/2018 CLINICAL  DATA:  Cough and congestion EXAM: CHEST - 2 VIEW COMPARISON:  None. FINDINGS: Lungs are clear. Heart size and pulmonary vascularity are normal. No adenopathy. No bone lesions. IMPRESSION: No edema or consolidation. Electronically Signed   By: Lowella Grip III M.D.   On: 02/19/2018 08:37    ____________________________________________   PROCEDURES  Procedure(s) performed: None  Procedures  Critical Care performed: No  ____________________________________________   INITIAL IMPRESSION / ASSESSMENT AND PLAN / ED COURSE  As part of my medical decision making, I reviewed the following data within the [facility]     Patient presents with cough, congestion, fever, and body aches for 5 days.  Patient physical exam is consistent with viral respiratory infection.  Patient negative flu test.  Patient given discharge care instruction advised take medication as directed.  Patient advised follow-up PCP.      ____________________________________________   FINAL CLINICAL IMPRESSION(S) / ED DIAGNOSES  Final diagnoses:  Influenza-like illness     ED Discharge Orders         Ordered    brompheniramine-pseudoephedrine-DM 30-2-10 MG/5ML  syrup  4 times daily PRN     02/19/18 0957    ibuprofen (ADVIL,MOTRIN) 600 MG tablet  Every 8 hours PRN     02/19/18 0957           Note:  This document was prepared using Dragon voice recognition software and may include unintentional dictation errors.    Sable Feil, PA-C 02/19/18 4034    Earleen Newport, MD 02/19/18 1114

## 2018-02-19 NOTE — ED Triage Notes (Signed)
Pt reports flu like sx's since Friday. Pt reports cough, congestion, fevers, bodyaches and nasal congestion. Pt reports husband had it first.

## 2018-11-17 ENCOUNTER — Encounter: Payer: Self-pay | Admitting: Obstetrics & Gynecology

## 2018-11-17 ENCOUNTER — Other Ambulatory Visit: Payer: Self-pay

## 2018-11-17 ENCOUNTER — Ambulatory Visit (INDEPENDENT_AMBULATORY_CARE_PROVIDER_SITE_OTHER): Payer: Medicaid Other | Admitting: Obstetrics & Gynecology

## 2018-11-17 ENCOUNTER — Other Ambulatory Visit (HOSPITAL_COMMUNITY)
Admission: RE | Admit: 2018-11-17 | Discharge: 2018-11-17 | Disposition: A | Discharge status: Home / Self Care | Payer: Medicaid Other | Source: Ambulatory Visit | Attending: Obstetrics & Gynecology | Admitting: Obstetrics & Gynecology

## 2018-11-17 VITALS — BP 130/88 | Ht 59.0 in | Wt 199.0 lb

## 2018-11-17 DIAGNOSIS — D219 Benign neoplasm of connective and other soft tissue, unspecified: Secondary | ICD-10-CM | POA: Insufficient documentation

## 2018-11-17 DIAGNOSIS — Z124 Encounter for screening for malignant neoplasm of cervix: Secondary | ICD-10-CM

## 2018-11-17 DIAGNOSIS — N946 Dysmenorrhea, unspecified: Secondary | ICD-10-CM

## 2018-11-17 DIAGNOSIS — N920 Excessive and frequent menstruation with regular cycle: Secondary | ICD-10-CM | POA: Diagnosis not present

## 2018-11-17 NOTE — Patient Instructions (Addendum)
Uterine Fibroids  Uterine fibroids (leiomyomas) are noncancerous (benign) tumors that can develop in the uterus. Fibroids may also develop in the fallopian tubes, cervix, or tissues (ligaments) near the uterus. You may have one or many fibroids. Fibroids vary in size, weight, and where they grow in the uterus. Some can become quite large. Most fibroids do not require medical treatment. What are the causes? The cause of this condition is not known. What increases the risk? You are more likely to develop this condition if you:  Are in your 30s or 40s and have not gone through menopause.  Have a family history of this condition.  Are of African-American descent.  Had your first period at an early age (early menarche).  Have not had any children (nulliparity).  Are overweight or obese. What are the signs or symptoms? Many women do not have any symptoms. Symptoms of this condition may include:  Heavy menstrual bleeding.  Bleeding or spotting between periods.  Pain and pressure in the pelvic area, between the hips.  Bladder problems, such as needing to urinate urgently or more often than usual.  Inability to have children (infertility).  Failure to carry pregnancy to term (miscarriage). How is this diagnosed? This condition may be diagnosed based on:  Your symptoms and medical history.  A physical exam.  A pelvic exam that includes feeling for any tumors.  Imaging tests, such as ultrasound or MRI. How is this treated? Treatment for this condition may include:  Seeing your health care provider for follow-up visits to monitor your fibroids for any changes.  Taking NSAIDs such as ibuprofen, naproxen, or aspirin to reduce pain.  Hormone medicines. These may be taken as a pill, given in an injection, or delivered by a T-shaped device that is inserted into the uterus (intrauterine device, IUD).  Surgery to remove one of the following: ? The fibroids (myomectomy). Your health  care provider may recommend this if fibroids affect your fertility and you want to become pregnant. ? The uterus (hysterectomy). ? Blood supply to the fibroids (uterine artery embolization). Follow these instructions at home:  Take over-the-counter and prescription medicines only as told by your health care provider.  Ask your health care provider if you should take iron pills or eat more iron-rich foods, such as dark green, leafy vegetables. Heavy menstrual bleeding can cause low iron levels.  If directed, apply heat to your back or abdomen to reduce pain. Use the heat source that your health care provider recommends, such as a moist heat pack or a heating pad. ? Place a towel between your skin and the heat source. ? Leave the heat on for 20-30 minutes. ? Remove the heat if your skin turns bright red. This is especially important if you are unable to feel pain, heat, or cold. You may have a greater risk of getting burned.  Pay close attention to your menstrual cycle. Tell your health care provider about any changes, such as: ? Increased blood flow that requires you to use more pads or tampons than usual. ? A change in the number of days that your period lasts. ? A change in symptoms that are associated with your period, such as back pain or cramps in your abdomen.  Keep all follow-up visits as told by your health care provider. This is important, especially if your fibroids need to be monitored for any changes. Contact a health care provider if you:  Have pelvic pain, back pain, or cramps in your abdomen that   do not get better with medicine or heat.  Develop new bleeding between periods.  Have increased bleeding during or between periods.  Feel unusually tired or weak.  Feel light-headed. Get help right away if you:  Faint.  Have pelvic pain that suddenly gets worse.  Have severe vaginal bleeding that soaks a tampon or pad in 30 minutes or less. Summary  Uterine fibroids are  noncancerous (benign) tumors that can develop in the uterus.  The exact cause of this condition is not known.  Most fibroids do not require medical treatment unless they affect your ability to have children (fertility).  Contact a health care provider if you have pelvic pain, back pain, or cramps in your abdomen that do not get better with medicines.  Make sure you know what symptoms should cause you to get help right away.   Transvaginal Ultrasound A transvaginal ultrasound, also called an endovaginal ultrasound, is a test that uses sound waves to take pictures of the female genital tract. The pictures are taken with a device, called a transducer, that is placed in the vagina. This test may be done to:  Check for problems with your pregnancy.  Check your developing baby.  Check for anything abnormal in the uterus or ovaries.  Find out why you have pelvic pain or bleeding. Tell a health care provider about:  Any allergies you have.  All medicines you are taking, including vitamins, herbs, eye drops, creams, and over-the-counter medicines.  Any blood disorders you have.  Any surgeries you have had.  Any medical conditions you have.  Whether you are pregnant or may be pregnant.  Whether you are having your menstrual period. What are the risks? This is a safe procedure. There are no known risks or complications of having this test. What happens before the procedure? This procedure needs to be done when your bladder is empty. Follow your health care provider's instructions about drinking fluids and emptying your bladder before the test. What happens during the procedure?   You will empty your bladder before the procedure.  You will undress from the waist down.  You will lie down on an exam table, with your knees bent and your feet in foot holders.  A health care provider will cover the transducer with a sterile cover.  A gel will be put on the transducer. The gel helps  transmit the sound waves and prevents irritation of your vagina.  The technician will insert the transducer into your vagina to get images. These will be displayed on a monitor that looks like a small television screen.  The transducer will be removed when the procedure is complete. The procedure may vary among health care providers and hospitals. What happens after the procedure?  It is up to you to get the results of your procedure. Ask your health care provider, or the department that is doing the procedure, when your results will be ready.  Keep all follow-up visits as told by your health care provider. This is important. Summary  A transvaginal ultrasound, also called an endovaginal ultrasound, is a test that uses sound waves to take pictures of the female genital tract.  This is a safe procedure. There are no known risks associated with this test.  The procedure needs to be done when your bladder is empty. Follow your health care provider's instructions about drinking fluids and emptying your bladder before the test.  During the procedure, you will undress from the waist down and lie down on  an exam table. A technician will insert a transducer into your vagina to obtain images.  Ask your health care provider, or the department that is doing the procedure, when your results will be ready. This information is not intended to replace advice given to you by your health care provider. Make sure you discuss any questions you have with your health care provider. Document Released: 11/30/2003 Document Revised: 07/31/2017 Document Reviewed: 07/31/2017 Elsevier Patient Education  2020 [employer].   Abdominal Hysterectomy Abdominal hysterectomy is a surgery to remove the womb. The womb is also called the uterus. The womb is the body part that holds a growing baby. This surgery may be done if you have certain problems of the womb. These may include cancer or growths in your womb. Other  problems include infection, long-term pain, very bad bleeding, or other problems with your monthly period. The procedure may also be done if:  Your womb has slipped down into your vagina.  You have a condition in which the tissue that lines the womb grows outside of its normal place. You may also need other parts removed that are used for creating babies. This will depend on why you need to have the surgery. These parts could include:  The lowest part of the womb, which opens into the vagina.  The parts that make eggs.  The tubes that connect the womb to the parts that make eggs. Tell your doctor about:  Any allergies you have.  All medicines you are taking, including vitamins, herbs, eye drops, creams, and over-the-counter medicines.  Any problems you or family members have had with medicines that make you fall asleep (anesthetic medicines).  Any blood disorders you have.  Any surgeries you have had.  Any medical conditions you have.  Whether you are pregnant or may be pregnant. What are the risks? Generally, this is a safe surgery. However, problems may occur, including:  Bleeding.  Infection.  Allergic reactions to medicines or dyes.  Damage to nearby parts.  Nerve injury.  Having less interest in sex or pain during sex.  Clumps of blood (clots) that can break free and move to your lungs. What happens before the procedure? Staying hydrated Follow instructions from your doctor about hydration. These may include:  Up to 2 hours before the procedure - you may continue to drink clear liquids. These include water, clear fruit juice, black coffee, and plain tea. Eating and drinking restrictions Follow instructions from your doctor about eating and drinking. These may include:  8 hours before the procedure - stop eating heavy meals or foods. These include meat, fried foods, or fatty foods.  6 hours before the procedure - stop eating light meals or foods. These include  toast or cereal.  6 hours before the procedure - stop drinking milk or drinks that contain milk.  2 hours before the procedure - stop drinking clear liquids. Medicines  Ask your doctor about: ? Changing or stopping your normal medicines. This is important. ? Taking aspirin and ibuprofen. Do not take these medicines unless your doctor tells you to take them. ? Taking over-the-counter medicines, vitamins, herbs, and supplements.  You may be asked to take a medicine that is used for trouble pooping (constipation). Surgery safety Ask your doctor:  How your surgery site will be marked.  What steps will be taken to help prevent the spread of germs. These steps may include: ? Removing hair at the surgery site. ? Washing skin with a germ-killing soap. ?  Taking antibiotic medicine. General instructions  Talk to your doctor about the changes this procedure may cause. These can affect your body and your feelings.  You may have an exam or testing. You may have a blood or pee (urine) sample taken.  Do not use any products that contain nicotine or tobacco before the procedure. This includes cigarettes, e-cigarettes, and chewing tobacco. Do this for at least 4 weeks. If you need help quitting, ask your doctor.  You may need to have an enema to clean out your butt and lower colon.  Plan to have someone take you home from the hospital or clinic. What happens during the procedure?  An IV tube will be inserted into one of your veins.  You may be given: ? A medicine to help you relax (sedative). ? A medicine to make you fall asleep.  Tight-fitting stockings will be placed on your legs to help with blood flow.  A thin, flexible tube will be placed to help drain your pee.  A cut (incision) will be made through the skin in your lower belly. It may go side-to-side or up-and-down.  The body tissue that covers your womb will be moved aside. Your womb and any other parts that need to be removed  will be carefully taken out.  Bleeding will be controlled with clamps or stitches (sutures).  Your cut will be closed with stitches, skin glue, or skin tape (adhesive).  A bandage (dressing) will be placed over the cut. The procedure may vary among doctors and hospitals. What happens after the procedure?  You will be monitored until you leave the hospital or clinic. This includes checking your blood pressure, heart rate, breathing rate, and blood oxygen level.  You will be given pain medicine if you need it.  You will need to stay in the hospital for 1-2 days. Ask your doctor how long you will need to stay in the hospital after your procedure.  You may have a liquid diet at first. You will most likely go back to your normal diet the day after surgery.  You will still have the tube in place for pee. The tube will likely be taken out the day after surgery.  You may have to wear tight-fitting stockings. These stockings help to prevent clumps of blood and reduce swelling in your legs.  You will be urged to walk as soon as possible. You will also use a machine (device) or do breathing exercises to keep your lungs clear.  You may need to use a pad in your underwear for fluids that come from your vagina. Summary  Abdominal hysterectomy is a surgery to remove your womb. The womb is the body part that holds a growing baby.  Talk to your doctor about the changes this procedure may cause. These can affect your body and your feelings.  You will be given pain medicine if you need it.  You will need to stay in the hospital for 1-2 days. Ask your doctor how long you will need to stay in the hospital after your procedure. This information is not intended to replace advice given to you by your health care provider. Make sure you discuss any questions you have with your health care provider. Document Released: 12/23/2012 Document Revised: 02/20/2018 Document Reviewed: 12/07/2015 Elsevier Patient  Education  2020 [employer].

## 2018-11-17 NOTE — Progress Notes (Signed)
Uterine Fibroids Patient is a 43 yo G2P2 AAF who presents with longstanding h/o uterine fibroids yet worsening symptoms over the past year. Periods are regular every 28-30 days, lasting 3-5 days. Dysmenorrhea:moderate, occurring 2 weeks leading up to cycles.. Cyclic symptoms include bladder pressure, frequency, and urgency; also vaginal pressure sx's. No intermenstrual bleeding, spotting, or discharge.  Prior NSVD x2.  Prior BTL.  No desire for fertility.    PMHx: She  has a past medical history of Anemia, Depression, GERD (gastroesophageal reflux disease), Hypertension, and Uterine fibroid. Also,  has a past surgical history that includes Tubal ligation (2009)., family history includes Breast cancer in her maternal aunt; Cancer in her maternal aunt.,  reports that she has never smoked. She has never used smokeless tobacco. She reports that she does not drink alcohol or use drugs.  She has a current medication list which includes the following prescription(s): cetirizine, hydrochlorothiazide, amoxicillin, brompheniramine-pseudoephedrine-dm, cyclobenzaprine, ferrous sulfate, fluoxetine, ibuprofen, ibuprofen, ibuprofen, naproxen, oxycodone-acetaminophen, potassium, and tramadol. Also, is allergic to orange (diagnostic) and tomato.  Review of Systems  Constitutional: Positive for malaise/fatigue. Negative for chills and fever.  HENT: Negative for congestion, sinus pain and sore throat.   Eyes: Negative for blurred vision and pain.  Respiratory: Negative for cough and wheezing.   Cardiovascular: Negative for chest pain and leg swelling.  Gastrointestinal: Negative for abdominal pain, constipation, diarrhea, heartburn, nausea and vomiting.  Genitourinary: Positive for frequency and urgency. Negative for dysuria and hematuria.  Musculoskeletal: Negative for back pain, joint pain, myalgias and neck pain.  Skin: Negative for itching and rash.  Neurological: Negative for dizziness, tremors and  weakness.  Endo/Heme/Allergies: Does not bruise/bleed easily.  Psychiatric/Behavioral: Negative for depression. The patient is not nervous/anxious and does not have insomnia.     Objective: BP 130/88   Ht 4\' 11"  (1.499 m)   Wt 199 lb (90.3 kg)   LMP 09/29/2018   BMI 40.19 kg/m  Physical Exam Constitutional:      General: She is not in acute distress.    Appearance: She is well-developed.  Genitourinary:     Pelvic exam was performed with patient supine.     Urethra, bladder, vagina, cervix and rectum normal.     No lesions in the vagina.     No vaginal bleeding.     No cervical polyp.     Uterus is enlarged and mobile.     Uterine mass present.    Uterus is midaxial and globular.     No right or left adnexal mass present.     Right adnexa not tender.     Left adnexa not tender.     Genitourinary Comments: Uterus 20 week sized  HENT:     Head: Normocephalic and atraumatic. No laceration.     Right Ear: Hearing normal.     Left Ear: Hearing normal.     Mouth/Throat:     Pharynx: Uvula midline.  Eyes:     Pupils: Pupils are equal, round, and reactive to light.  Neck:     Musculoskeletal: Normal range of motion and neck supple.     Thyroid: No thyromegaly.  Cardiovascular:     Rate and Rhythm: Normal rate and regular rhythm.     Heart sounds: No murmur. No friction rub. No gallop.   Pulmonary:     Effort: Pulmonary effort is normal. No respiratory distress.     Breath sounds: Normal breath sounds. No wheezing.  Abdominal:     General: Bowel sounds  are normal. There is no distension.     Palpations: Abdomen is soft.     Tenderness: There is no abdominal tenderness. There is no rebound.     Comments: Fibroids palpated at level of umbilicus  Musculoskeletal: Normal range of motion.  Neurological:     Mental Status: She is alert and oriented to person, place, and time.     Cranial Nerves: No cranial nerve deficit.  Skin:    General: Skin is warm and dry.  Psychiatric:         Judgment: Judgment normal.  Vitals signs reviewed.    ASSESSMENT/PLAN:    Problem List Items Addressed This Visit      Genitourinary   Dysmenorrhea     Other   Fibroids - Primary   Relevant Orders   US PELVIC COMPLETE WITH TRANSVAGINAL   Menorrhagia with regular cycle   Relevant Orders   US PELVIC COMPLETE WITH TRANSVAGINAL    Other Visit Diagnoses    Screening for cervical cancer       Relevant Orders   Cytology - PAP    Patient has abnormal uterine bleeding . She has a normal exam today, with no evidence of lesions.  Evaluation includes the following: exam, labs such as hormonal testing, and pelvic ultrasound to evaluate for any structural gynecologic abnormalities.  Patient to follow up after testing.  Treatment option for menorrhagia or menometrorrhagia discussed in great detail with the patient.  Options include hormonal therapy, IUD therapy such as Mirena, D&C, Ablation, and Hysterectomy.  The pros and cons of each option discussed with patient.  Fibroid treatment such as [place], Lupron, Myomectomy, and Hysterectomy also discussed in detail, with the pros and cons of each choice counseled.  No treatment as an option also discussed, as well as control of symptoms alone with hormone therapy. Information provided to the patient.  Barnett Applebaum, MD, [facility], [facility] 11/17/2018  10:22 AM

## 2018-11-20 LAB — CYTOLOGY - PAP

## 2018-11-20 NOTE — Progress Notes (Signed)
Will discuss after ultrasound. If planning hysterectomy, then will forgo colposcopy. Otherwise will schedule colpo/biopsies

## 2018-11-24 ENCOUNTER — Encounter: Payer: Self-pay | Admitting: Obstetrics & Gynecology

## 2018-11-24 ENCOUNTER — Other Ambulatory Visit: Payer: Self-pay

## 2018-11-24 ENCOUNTER — Ambulatory Visit (INDEPENDENT_AMBULATORY_CARE_PROVIDER_SITE_OTHER): Payer: Medicaid Other | Admitting: Obstetrics & Gynecology

## 2018-11-24 ENCOUNTER — Ambulatory Visit (INDEPENDENT_AMBULATORY_CARE_PROVIDER_SITE_OTHER): Payer: Medicaid Other

## 2018-11-24 VITALS — BP 120/80 | Temp 97.2°F | Ht 59.0 in | Wt 196.0 lb

## 2018-11-24 DIAGNOSIS — N920 Excessive and frequent menstruation with regular cycle: Secondary | ICD-10-CM

## 2018-11-24 DIAGNOSIS — D219 Benign neoplasm of connective and other soft tissue, unspecified: Secondary | ICD-10-CM

## 2018-11-24 DIAGNOSIS — N946 Dysmenorrhea, unspecified: Secondary | ICD-10-CM | POA: Diagnosis not present

## 2018-11-24 DIAGNOSIS — D252 Subserosal leiomyoma of uterus: Secondary | ICD-10-CM

## 2018-11-24 DIAGNOSIS — D251 Intramural leiomyoma of uterus: Secondary | ICD-10-CM | POA: Diagnosis not present

## 2018-11-24 NOTE — Progress Notes (Signed)
HPI: Uterine Fibroids Patient presents with uterine fibroids.  Periods are regular every 28-30 days, lasting 3-5 days. Dysmenorrhea:moderate, occurring 2 weeks leading up to cycles.. Cyclic symptoms include bladder pressure, frequency, and urgency; also vaginal pressure sx's. No intermenstrual bleeding, spotting, or discharge.  Prior NSVD x2.  Prior BTL.  No desire for fertility.  Ultrasound demonstrates multiple fibroids, see below. Prior exam, 20 week size uterus.  PMHx: She  has a past medical history of Anemia, Depression, GERD (gastroesophageal reflux disease), Hypertension, and Uterine fibroid. Also,  has a past surgical history that includes Tubal ligation (2009)., family history includes Breast cancer in her maternal aunt; Cancer in her maternal aunt.,  reports that she has never smoked. She has never used smokeless tobacco. She reports that she does not drink alcohol or use drugs.  She has a current medication list which includes the following prescription(s): brompheniramine-pseudoephedrine-dm, cetirizine, cyclobenzaprine, ferrous sulfate, fluoxetine, hydrochlorothiazide, ibuprofen, ibuprofen, ibuprofen, naproxen, oxycodone-acetaminophen, potassium, tramadol, and amoxicillin. Also, is allergic to orange (diagnostic) and tomato.  Review of Systems  All other systems reviewed and are negative.   Objective: BP 120/80   Temp (!) 97.2 F (36.2 C)   Ht 4\' 11"  (1.499 m)   Wt 196 lb (88.9 kg)   BMI 39.59 kg/m   Physical examination Constitutional NAD, Conversant  Skin No rashes, lesions or ulceration.   Extremities: Moves all appropriately.  Normal ROM for age. No lymphadenopathy.  Neuro: Grossly intact  Psych: Oriented to PPT.  Normal mood. Normal affect.   US Pelvic Complete With Transvaginal  Result Date: 11/24/2018 Patient Name: Christie Alexander DOB: [birth year] MRN: [id number] ULTRASOUND REPORT Location: [facility] Date of Service: 11/24/2018 Indications: Fibroids  Findings: The uterus is anteverted and measures 16.5 x 11.6 x 8.6 cm. Echo texture is heterogenous with evidence of focal masses. Within the uterus are multiple suspected fibroids measuring: Fibroid 1:46.7 x 52.9 x 43.0 mm intramural posterior/calcified rim Fibroid 2: 51.8 x 45.5 x 51.6 mm anterior subserosal lower uterine segment Fibroid 3: 40.4 x 28.8 x 39.7 mm intramural anterior Fibroid 4: 63.5 x 49.3 x 49.4 mm subserosal left calcified rim Fibroid 5: 42.8 x 20.9 x 33.5 mm subserosal anterior fundal The Endometrium measures 1.6 mm. Right Ovary measures 4.5 x 4.5 x 2.4 cm. It is normal in appearance. Left Ovary measures 3.0 x 3.5 x 1.9 cm. It is normal in appearance. Survey of the adnexa demonstrates no adnexal masses. There is no free fluid in the cul de sac. Impression: 1. There are multiple uterine fibroids, to numerous to count. The largest ones were measured. 2. Thin endometrium 3. Normal appearing cervix and ovaries. Recommendations: 1.Clinical correlation with the patient's History and Physical Exam. Gweneth Dimitri, RT Review of ULTRASOUND.    I have personally reviewed images and report of recent ultrasound done at [facility].    Plan of management to be discussed with patient. Barnett Applebaum, MD, [facility], [facility] 11/24/2018  4:50 PM   Assessment:  Fibroids  Menorrhagia with regular cycle  Dysmenorrhea  Fibroid treatment such as [place], Lupron, Myomectomy, and Hysterectomy discussed in detail, with the pros and cons of each choice counseled.  No treatment as an option also discussed, as well as control of symptoms alone with hormone therapy. Information provided to the patient. Desires TAH    Limitations w Covid discussed, will schedule as able    Consider Lupron if we have to delay surgery to minimize growth  A total of  15 minutes were spent face-to-face with the patient during this encounter and over half of that time dealt with counseling and coordination of  care.  Barnett Applebaum, MD, [facility], [facility] 11/24/2018  5:00 PM

## 2018-12-04 ENCOUNTER — Telehealth: Payer: Self-pay | Admitting: Obstetrics & Gynecology

## 2018-12-04 NOTE — Telephone Encounter (Signed)
-----   Message from Gae Dry, MD sent at 11/24/2018  5:01 PM EST ----- Regarding: surgery Surgery Booking Request Patient Full Name:  ELAF CHELL  MRN: [id number]  DOB: 02/10/1975  Surgeon: Hoyt Koch, MD  Requested Surgery Date and Time: Any Primary Diagnosis AND Code: 1. Fibroids  D21.9  2. Menorrhagia with regular cycle  N92.0  3. Dysmenorrhea  N94.6  Secondary Diagnosis and Code:  Surgical Procedure: TAH BS L&D Notification: No Admission Status: surgery admit Length of Surgery: 1.5 hr Special Case Needs: No H&P: Yes Phone Interview???:  Yes Interpreter: No Language:  Medical Clearance:  No Special Scheduling Instructions: none Any known health/anesthesia issues, diabetes, sleep apnea, latex allergy, defibrillator/pacemaker?: No Acuity: P3   (P1 highest, P2 delay may cause harm, P3 low, elective gyn, P4 lowest)

## 2018-12-04 NOTE — Telephone Encounter (Signed)
Patient is aware of H&P at [facility] on 12/16/18 @ 1:20pm w/ Dr. Kenton Kingfisher, Pre-admit testing to be scheduled, COVID testing on 12/18, and OR on 12/23/18. Patient is aware to quarantine after COVID testing. Patient is aware she may receive calls from the [place] and [facility]. Patient confirmed Medicaid.

## 2018-12-16 ENCOUNTER — Encounter: Payer: Medicaid Other | Admitting: Obstetrics & Gynecology

## 2018-12-16 ENCOUNTER — Telehealth: Payer: Self-pay | Admitting: Obstetrics & Gynecology

## 2018-12-16 NOTE — Telephone Encounter (Signed)
Patient is aware all inpatient surgeries are cancelled. Patient is aware of H&P on 1/18 @ 1:30pm w/ Dr. Kenton Kingfisher, Pre-admit testing to be rescheduled, COVID testing on 1/26, and OR on 01/29/19.

## 2018-12-19 ENCOUNTER — Inpatient Hospital Stay: Admission: RE | Admit: 2018-12-19 | Payer: Medicaid Other | Source: Ambulatory Visit

## 2019-01-19 ENCOUNTER — Encounter: Payer: Medicaid Other | Admitting: Obstetrics & Gynecology

## 2019-01-23 ENCOUNTER — Other Ambulatory Visit: Payer: Medicaid Other

## 2019-01-26 ENCOUNTER — Other Ambulatory Visit: Payer: Medicaid Other

## 2019-01-29 ENCOUNTER — Telehealth: Payer: Self-pay | Admitting: Obstetrics & Gynecology

## 2019-01-29 NOTE — Telephone Encounter (Signed)
Patient is aware of rescheduled H&P at [facility] on 2/17 @ 10:20am, Pre-admit testing to be rescheduled, COVID testing on 2/19, and OR on 02/24/19. Patient is aware to quarantine after COVID testing. Patient is aware she may receive calls from the [place] and [place]. Patient confirmed Medicaid.

## 2019-01-29 NOTE — Telephone Encounter (Signed)
OR Aware of overnight stay?

## 2019-02-13 ENCOUNTER — Other Ambulatory Visit: Payer: Medicaid Other

## 2019-02-18 ENCOUNTER — Encounter: Payer: Self-pay | Admitting: Obstetrics & Gynecology

## 2019-02-18 ENCOUNTER — Other Ambulatory Visit: Payer: Self-pay

## 2019-02-18 ENCOUNTER — Ambulatory Visit (INDEPENDENT_AMBULATORY_CARE_PROVIDER_SITE_OTHER): Payer: Medicaid Other | Admitting: Obstetrics & Gynecology

## 2019-02-18 VITALS — BP 120/80 | Ht 59.0 in | Wt 202.0 lb

## 2019-02-18 DIAGNOSIS — D251 Intramural leiomyoma of uterus: Secondary | ICD-10-CM

## 2019-02-18 DIAGNOSIS — D219 Benign neoplasm of connective and other soft tissue, unspecified: Secondary | ICD-10-CM

## 2019-02-18 DIAGNOSIS — N946 Dysmenorrhea, unspecified: Secondary | ICD-10-CM | POA: Diagnosis not present

## 2019-02-18 DIAGNOSIS — N920 Excessive and frequent menstruation with regular cycle: Secondary | ICD-10-CM | POA: Diagnosis not present

## 2019-02-18 DIAGNOSIS — D252 Subserosal leiomyoma of uterus: Secondary | ICD-10-CM

## 2019-02-18 NOTE — Patient Instructions (Signed)
PRE ADMISSION TESTING For Covid, prior to procedure Friday 9:00-10:00 [facility] entrance (drive up)  Results in 48-72 hours You will not receive notification if test results are negative. If positive for Covid19, your provider will notify you by phone, with additional instructions.   Abdominal Hysterectomy, Care After This sheet gives you information about how to care for yourself after your procedure. Your doctor may also give you more specific instructions. If you have problems or questions, contact your doctor. Follow these instructions at home: Bathing  Do not take baths, swim, or use a hot tub until your doctor says it is okay. Ask your doctor if you can take showers. You may only be allowed to take sponge baths for bathing.  Keep the bandage (dressing) dry until your doctor says it can be taken off. Surgical cut (incision) care      Follow instructions from your doctor about how to take care of your cut from surgery. Make sure you: ? Wash your hands with soap and water before you change your bandage (dressing). If you cannot use soap and water, use hand sanitizer. ? Change your bandage as told by your doctor. ? Leave stitches (sutures), skin glue, or skin tape (adhesive) strips in place. They may need to stay in place for 2 weeks or longer. If tape strips get loose and curl up, you may trim the loose edges. Do not remove tape strips completely unless your doctor says it is okay.  Check your surgical cut area every day for signs of infection. Check for: ? Redness, swelling, or pain. ? Fluid or blood. ? Warmth. ? Pus or a bad smell. Activity  Do gentle, daily exercise as told by your doctor. You may be told to take short walks every day and go farther each time.  Do not lift anything that is heavier than 10 lb (4.5 kg), or the limit that your doctor tells you, until he or she says that it is safe.  Do not drive or use heavy machinery while taking prescription  pain medicine.  Do not drive for 24 hours if you were given a medicine to help you relax (sedative).  Follow your doctor's advice about exercise, driving, and general activities. Ask your doctor what activities are safe for you. Lifestyle   Do not douche, use tampons, or have sex for at least 6 weeks or as told by your doctor.  Do not drink alcohol until your doctor says it is okay.  Drink enough fluid to keep your pee (urine) clear or pale yellow.  Try to have someone at home with you for the first 1-2 weeks to help.  Do not use any products that contain nicotine or tobacco, such as cigarettes and e-cigarettes. These can slow down healing. If you need help quitting, ask your doctor. General instructions  Take over-the-counter and prescription medicines only as told by your doctor.  Do not take aspirin or ibuprofen. These medicines can cause bleeding.  To prevent or treat constipation while you are taking prescription pain medicine, your doctor may suggest that you: ? Drink enough fluid to keep your urine clear or pale yellow. ? Take over-the-counter or prescription medicines. ? Eat foods that are high in fiber, such as:  Fresh fruits and vegetables.  Whole grains.  Beans. ? Limit foods that are high in fat and processed sugars, such as fried and sweet foods.  Keep all follow-up visits as told by your doctor. This is important. Contact a doctor   if:  You have chills or fever.  You have redness, swelling, or pain around your cut.  You have fluid or blood coming from your cut.  Your cut feels warm to the touch.  You have pus or a bad smell coming from your cut.  Your cut breaks open.  You feel dizzy or light-headed.  You have pain or bleeding when you pee.  You keep having watery poop (diarrhea).  You keep feeling sick to your stomach (nauseous) or keep throwing up (vomiting).  You have unusual fluid (discharge) coming from your vagina.  You have a  rash.  You have a reaction to your medicine.  Your pain medicine does not help. Get help right away if:  You have a fever and your symptoms get worse all of a sudden.  You have very bad belly (abdominal) pain.  You are short of breath.  You pass out (faint).  You have pain, swelling, or redness of your leg.  You bleed a lot from your vagina and notice clumps of blood (clots). Summary  Do not take baths, swim, or use a hot tub until your doctor says it is okay. Ask your doctor if you can take showers. You may only be allowed to take sponge baths for bathing.  Follow your doctor's advice about exercise, driving, and general activities. Ask your doctor what activities are safe for you.  Do not lift anything that is heavier than 10 lb (4.5 kg), or the limit that your doctor tells you, until he or she says that it is safe.  Try to have someone at home with you for the first 1-2 weeks to help. This information is not intended to replace advice given to you by your health care provider. Make sure you discuss any questions you have with your health care provider. Document Revised: 02/20/2018 Document Reviewed: 12/07/2015 Elsevier Patient Education  2020 Elsevier Inc.  

## 2019-02-18 NOTE — H&P (View-Only) (Signed)
PRE-OPERATIVE HISTORY AND PHYSICAL EXAM  HPI:  Christie Alexander is a 44 y.o. [id number] Patient's last menstrual period was 01/24/2019.; she is being admitted for surgery related to fibroids.   Periods areregular every 28-30 days, lasting3-5days and heavy. Dysmenorrhea:moderate, occurring2 weeks leading up to cycles.. Cyclic symptoms includebladder pressure, frequency, and urgency; also vaginal pressure sx's. No intermenstrual bleeding, spotting, or discharge.  Prior NSVD x2. Prior BTL. No desire for fertility.  Ultrasound demonstrates multiple fibroids. Fibroid 1:46.7 x 52.9 x 43.0 mm intramural posterior/calcified rim Fibroid 2: 51.8 x 45.5 x 51.6 mm anterior subserosal lower uterine segment Fibroid 3: 40.4 x 28.8 x 39.7 mm intramural anterior Fibroid 4: 63.5 x 49.3 x 49.4 mm subserosal left calcified rim Fibroid 5: 42.8 x 20.9 x 33.5 mm subserosal anterior fundal Prior exam, 20 week size uterus.  PMHx: Past Medical History:  Diagnosis Date  . Anemia   . Depression   . GERD (gastroesophageal reflux disease)    on occasion  . Hypertension   . Uterine fibroid    Past Surgical History:  Procedure Laterality Date  . TUBAL LIGATION  2009   Family History  Problem Relation Age of Onset  . Cancer Maternal Aunt   . Breast cancer Maternal Aunt    Social History   Tobacco Use  . Smoking status: Never Smoker  . Smokeless tobacco: Never Used  Substance Use Topics  . Alcohol use: No  . Drug use: No    Current Outpatient Medications:  .  cetirizine (ZYRTEC) 10 MG tablet, Take 10 mg by mouth daily. , Disp: , Rfl:  .  hydrochlorothiazide (HYDRODIURIL) 12.5 MG tablet, Take 12.5 mg by mouth daily., Disp: , Rfl:  .  ibuprofen (ADVIL) 800 MG tablet, Take 800 mg by mouth every 8 (eight) hours as needed (pain)., Disp: , Rfl:  .  omeprazole (PRILOSEC) 20 MG capsule, Take 20 mg by mouth daily., Disp: , Rfl:  .  traZODone (DESYREL) 50 MG tablet, Take 50 mg by mouth at bedtime.,  Disp: , Rfl:  Allergies: Orange (diagnostic) and Tomato  Review of Systems  Constitutional: Positive for malaise/fatigue. Negative for chills and fever.  HENT: Negative for congestion, sinus pain and sore throat.   Eyes: Negative for blurred vision and pain.  Respiratory: Negative for cough and wheezing.   Cardiovascular: Negative for chest pain and leg swelling.  Gastrointestinal: Positive for abdominal pain. Negative for constipation, diarrhea, heartburn, nausea and vomiting.  Genitourinary: Positive for frequency and urgency. Negative for dysuria and hematuria.  Musculoskeletal: Negative for back pain, joint pain, myalgias and neck pain.  Skin: Negative for itching and rash.  Neurological: Negative for dizziness, tremors and weakness.  Endo/Heme/Allergies: Does not bruise/bleed easily.  Psychiatric/Behavioral: Negative for depression. The patient is not nervous/anxious and does not have insomnia.     Objective: BP 120/80   Ht 4\' 11"  (1.499 m)   Wt 202 lb (91.6 kg)   LMP 01/24/2019   BMI 40.80 kg/m   Filed Weights   02/18/19 1007  Weight: 202 lb (91.6 kg)   Physical Exam Constitutional:      General: She is not in acute distress.    Appearance: She is well-developed.  HENT:     Head: Normocephalic and atraumatic. No laceration.     Right Ear: Hearing normal.     Left Ear: Hearing normal.     Mouth/Throat:     Pharynx: Uvula midline.  Eyes:     Pupils: Pupils are  equal, round, and reactive to light.  Neck:     Thyroid: No thyromegaly.  Cardiovascular:     Rate and Rhythm: Normal rate and regular rhythm.     Heart sounds: No murmur. No friction rub. No gallop.   Pulmonary:     Effort: Pulmonary effort is normal. No respiratory distress.     Breath sounds: Normal breath sounds. No wheezing.  Chest:     Breasts:        Right: No mass, skin change or tenderness.        Left: No mass, skin change or tenderness.  Abdominal:     General: Bowel sounds are normal. There  is no distension.     Palpations: Abdomen is soft.     Tenderness: There is no abdominal tenderness. There is no rebound.     Comments: Fibroid uterus at level of Umbilicus Mildly T  Musculoskeletal:        General: Normal range of motion.     Cervical back: Normal range of motion and neck supple.  Neurological:     Mental Status: She is alert and oriented to person, place, and time.     Cranial Nerves: No cranial nerve deficit.  Skin:    General: Skin is warm and dry.  Psychiatric:        Judgment: Judgment normal.  Vitals reviewed.    Assessment: 1. Fibroids   2. Menorrhagia with regular cycle   3. Dysmenorrhea   All options discussed, pros and cons of TAH discussed Preservation of ovaries- pros and cons counseled Recovery expectations discussed  I have had a careful discussion with this patient about all the options available and the risk/benefits of each. I have fully informed this patient that surgery may subject her to a variety of discomforts and risks: She understands that most patients have surgery with little difficulty, but problems can happen ranging from minor to fatal. These include nausea, vomiting, pain, bleeding, infection, poor healing, hernia, or formation of adhesions. Unexpected reactions may occur from any drug or anesthetic given. Unintended injury may occur to other pelvic or abdominal structures such as Fallopian tubes, ovaries, bladder, ureter (tube from kidney to bladder), or bowel. Nerves going from the pelvis to the legs may be injured. Any such injury may require immediate or later additional surgery to correct the problem. Excessive blood loss requiring transfusion is very unlikely but possible. Dangerous blood clots may form in the legs or lungs. Physical and sexual activity will be restricted in varying degrees for an indeterminate period of time but most often 2-6 weeks.  Finally, she understands that it is impossible to list every possible undesirable  effect and that the condition for which surgery is done is not always cured or significantly improved, and in rare cases may be even worse.Ample time was given to answer all questions.  Barnett Applebaum, MD, [facility], [facility] 02/18/2019  10:52 AM

## 2019-02-18 NOTE — Progress Notes (Signed)
PRE-OPERATIVE HISTORY AND PHYSICAL EXAM  HPI:  Christie Alexander is a 44 y.o. [id number] Patient's last menstrual period was 01/24/2019.; she is being admitted for surgery related to fibroids.   Periods areregular every 28-30 days, lasting3-5days and heavy. Dysmenorrhea:moderate, occurring2 weeks leading up to cycles.. Cyclic symptoms includebladder pressure, frequency, and urgency; also vaginal pressure sx's. No intermenstrual bleeding, spotting, or discharge.  Prior NSVD x2. Prior BTL. No desire for fertility.  Ultrasound demonstrates multiple fibroids. Fibroid 1:46.7 x 52.9 x 43.0 mm intramural posterior/calcified rim Fibroid 2: 51.8 x 45.5 x 51.6 mm anterior subserosal lower uterine segment Fibroid 3: 40.4 x 28.8 x 39.7 mm intramural anterior Fibroid 4: 63.5 x 49.3 x 49.4 mm subserosal left calcified rim Fibroid 5: 42.8 x 20.9 x 33.5 mm subserosal anterior fundal Prior exam, 20 week size uterus.  PMHx: Past Medical History:  Diagnosis Date  . Anemia   . Depression   . GERD (gastroesophageal reflux disease)    on occasion  . Hypertension   . Uterine fibroid    Past Surgical History:  Procedure Laterality Date  . TUBAL LIGATION  2009   Family History  Problem Relation Age of Onset  . Cancer Maternal Aunt   . Breast cancer Maternal Aunt    Social History   Tobacco Use  . Smoking status: Never Smoker  . Smokeless tobacco: Never Used  Substance Use Topics  . Alcohol use: No  . Drug use: No    Current Outpatient Medications:  .  cetirizine (ZYRTEC) 10 MG tablet, Take 10 mg by mouth daily. , Disp: , Rfl:  .  hydrochlorothiazide (HYDRODIURIL) 12.5 MG tablet, Take 12.5 mg by mouth daily., Disp: , Rfl:  .  ibuprofen (ADVIL) 800 MG tablet, Take 800 mg by mouth every 8 (eight) hours as needed (pain)., Disp: , Rfl:  .  omeprazole (PRILOSEC) 20 MG capsule, Take 20 mg by mouth daily., Disp: , Rfl:  .  traZODone (DESYREL) 50 MG tablet, Take 50 mg by mouth at bedtime.,  Disp: , Rfl:  Allergies: Orange (diagnostic) and Tomato  Review of Systems  Constitutional: Positive for malaise/fatigue. Negative for chills and fever.  HENT: Negative for congestion, sinus pain and sore throat.   Eyes: Negative for blurred vision and pain.  Respiratory: Negative for cough and wheezing.   Cardiovascular: Negative for chest pain and leg swelling.  Gastrointestinal: Positive for abdominal pain. Negative for constipation, diarrhea, heartburn, nausea and vomiting.  Genitourinary: Positive for frequency and urgency. Negative for dysuria and hematuria.  Musculoskeletal: Negative for back pain, joint pain, myalgias and neck pain.  Skin: Negative for itching and rash.  Neurological: Negative for dizziness, tremors and weakness.  Endo/Heme/Allergies: Does not bruise/bleed easily.  Psychiatric/Behavioral: Negative for depression. The patient is not nervous/anxious and does not have insomnia.     Objective: BP 120/80   Ht 4\' 11"  (1.499 m)   Wt 202 lb (91.6 kg)   LMP 01/24/2019   BMI 40.80 kg/m   Filed Weights   02/18/19 1007  Weight: 202 lb (91.6 kg)   Physical Exam Constitutional:      General: She is not in acute distress.    Appearance: She is well-developed.  HENT:     Head: Normocephalic and atraumatic. No laceration.     Right Ear: Hearing normal.     Left Ear: Hearing normal.     Mouth/Throat:     Pharynx: Uvula midline.  Eyes:     Pupils: Pupils are  equal, round, and reactive to light.  Neck:     Thyroid: No thyromegaly.  Cardiovascular:     Rate and Rhythm: Normal rate and regular rhythm.     Heart sounds: No murmur. No friction rub. No gallop.   Pulmonary:     Effort: Pulmonary effort is normal. No respiratory distress.     Breath sounds: Normal breath sounds. No wheezing.  Chest:     Breasts:        Right: No mass, skin change or tenderness.        Left: No mass, skin change or tenderness.  Abdominal:     General: Bowel sounds are normal. There  is no distension.     Palpations: Abdomen is soft.     Tenderness: There is no abdominal tenderness. There is no rebound.     Comments: Fibroid uterus at level of Umbilicus Mildly T  Musculoskeletal:        General: Normal range of motion.     Cervical back: Normal range of motion and neck supple.  Neurological:     Mental Status: She is alert and oriented to person, place, and time.     Cranial Nerves: No cranial nerve deficit.  Skin:    General: Skin is warm and dry.  Psychiatric:        Judgment: Judgment normal.  Vitals reviewed.    Assessment: 1. Fibroids   2. Menorrhagia with regular cycle   3. Dysmenorrhea   All options discussed, pros and cons of TAH discussed Preservation of ovaries- pros and cons counseled Recovery expectations discussed  I have had a careful discussion with this patient about all the options available and the risk/benefits of each. I have fully informed this patient that surgery may subject her to a variety of discomforts and risks: She understands that most patients have surgery with little difficulty, but problems can happen ranging from minor to fatal. These include nausea, vomiting, pain, bleeding, infection, poor healing, hernia, or formation of adhesions. Unexpected reactions may occur from any drug or anesthetic given. Unintended injury may occur to other pelvic or abdominal structures such as Fallopian tubes, ovaries, bladder, ureter (tube from kidney to bladder), or bowel. Nerves going from the pelvis to the legs may be injured. Any such injury may require immediate or later additional surgery to correct the problem. Excessive blood loss requiring transfusion is very unlikely but possible. Dangerous blood clots may form in the legs or lungs. Physical and sexual activity will be restricted in varying degrees for an indeterminate period of time but most often 2-6 weeks.  Finally, she understands that it is impossible to list every possible undesirable  effect and that the condition for which surgery is done is not always cured or significantly improved, and in rare cases may be even worse.Ample time was given to answer all questions.  Barnett Applebaum, MD, [facility], [facility] 02/18/2019  10:52 AM

## 2019-02-18 NOTE — Addendum Note (Signed)
Addended by: Gae Dry on: 02/18/2019 10:56 AM   Modules accepted: Orders, SmartSet

## 2019-02-19 ENCOUNTER — Encounter
Admission: RE | Admit: 2019-02-19 | Discharge: 2019-02-19 | Disposition: A | Discharge status: Home / Self Care | Payer: Medicaid Other | Source: Ambulatory Visit | Attending: Obstetrics & Gynecology | Admitting: Obstetrics & Gynecology

## 2019-02-19 DIAGNOSIS — Z01811 Encounter for preprocedural respiratory examination: Secondary | ICD-10-CM | POA: Diagnosis not present

## 2019-02-19 NOTE — Patient Instructions (Signed)
Your procedure is scheduled on: 02-24-19 TUESDAY Report to Same Day Surgery 2nd floor medical mall [facility] Entrance-take elevator on left to 2nd floor.  Check in with surgery information desk.) To find out your arrival time please call [phone] between 1PM - 3PM on 02-23-19 MONDAY  Remember: Instructions that are not followed completely may result in serious medical risk, up to and including death, or upon the discretion of your surgeon and anesthesiologist your surgery may need to be rescheduled.    _x___ 1. Do not eat food after midnight the night before your procedure. NO GUM OR CANDY AFTER MIDNIGHT. You may drink clear liquids up to 2 hours before you are scheduled to arrive at the hospital for your procedure.  Do not drink clear liquids within 2 hours of your scheduled arrival to the hospital.  Clear liquids include  --Water or Apple juice without pulp  --Gatorade  --Black Coffee or Clear Tea (No milk, no creamers, do not add anything to the coffee or Tea   ____Ensure clear carbohydrate drink on the way to the hospital for bariatric patients  ____Ensure clear carbohydrate drink 3 hours before surgery.     __x__ 2. No Alcohol for 24 hours before or after surgery.   __x__3. No Smoking or e-cigarettes for 24 prior to surgery.  Do not use any chewable tobacco products for at least 6 hour prior to surgery   ____  4. Bring all medications with you on the day of surgery if instructed.    __x__ 5. Notify your doctor if there is any change in your medical condition     (cold, fever, infections).    x___6. On the morning of surgery brush your teeth with toothpaste and water.  You may rinse your mouth with mouth wash if you wish.  Do not swallow any toothpaste or mouthwash.   Do not wear jewelry, make-up, hairpins, clips or nail polish.  Do not wear lotions, powders, or perfumes.  Do not shave 48 hours prior to surgery. Men may shave face and neck.  Do not bring valuables to the  hospital.    [facility] is not responsible for any belongings or valuables.               Contacts, dentures or bridgework may not be worn into surgery.  Leave your suitcase in the car. After surgery it may be brought to your room.  For patients admitted to the hospital, discharge time is determined by your treatment team.  _  Patients discharged the day of surgery will not be allowed to drive home.  You will need someone to drive you home and stay with you the night of your procedure.    Please read over the following fact sheets that you were given:   [facility] Preparing for Surgery/[facility] SP[facility]OMETER-BRING THIS TO HOPSITAL  _x___ TAKE THE FOLLOWING MEDICATION THE MORNING OF SURGERY WITH A SMALL SIP OF WATER. These include:  1. ZYRTEC (CETIRIZINE)  2. PRILOSEC (OMEPRAZOLE)  3. TAKE A PRILOSEC THE NIGHT BEFORE YOUR SURGERY  4.  5.  6.  ____Fleets enema or Magnesium Citrate as directed.   _x___ Use CHG Soap or sage wipes as directed on instruction sheet   ____ Use inhalers on the day of surgery and bring to hospital day of surgery  ____ Stop Metformin and Janumet 2 days prior to surgery.    ____ Take 1/2 of usual insulin dose the night before surgery and none on the  morning surgery.   ____ Follow recommendations from Cardiologist, Pulmonologist or PCP regarding stopping Aspirin, Coumadin, Plavix ,Eliquis, Effient, or Pradaxa, and Pletal.  X____Stop Anti-inflammatories such as Advil, Aleve, Ibuprofen, Motrin, Naproxen, Naprosyn, Goodies powders or aspirin products NOW-OK to take Tylenol    ____ Stop supplements until after surgery.   ____ Bring C-Pap to the hospital.

## 2019-02-20 ENCOUNTER — Encounter
Admission: RE | Admit: 2019-02-20 | Discharge: 2019-02-20 | Disposition: A | Discharge status: Home / Self Care | Payer: Medicaid Other | Source: Ambulatory Visit | Attending: Obstetrics & Gynecology | Admitting: Obstetrics & Gynecology

## 2019-02-20 ENCOUNTER — Other Ambulatory Visit: Payer: Self-pay

## 2019-02-20 DIAGNOSIS — I10 Essential (primary) hypertension: Secondary | ICD-10-CM | POA: Insufficient documentation

## 2019-02-20 DIAGNOSIS — Z01818 Encounter for other preprocedural examination: Secondary | ICD-10-CM | POA: Insufficient documentation

## 2019-02-20 DIAGNOSIS — Z20822 Contact with and (suspected) exposure to covid-19: Secondary | ICD-10-CM | POA: Insufficient documentation

## 2019-02-20 LAB — TYPE AND SCREEN
ABO/RH(D): B POS
Antibody Screen: NEGATIVE

## 2019-02-20 LAB — CBC
HCT: 34 % — ABNORMAL LOW (ref 36.0–46.0)
Hemoglobin: 10.8 g/dL — ABNORMAL LOW (ref 12.0–15.0)
MCH: 27 pg (ref 26.0–34.0)
MCHC: 31.8 g/dL (ref 30.0–36.0)
MCV: 85 fL (ref 80.0–100.0)
Platelets: 337 10*3/uL (ref 150–400)
RBC: 4 MIL/uL (ref 3.87–5.11)
RDW: 15 % (ref 11.5–15.5)
WBC: 6.2 10*3/uL (ref 4.0–10.5)
nRBC: 0 % (ref 0.0–0.2)

## 2019-02-20 LAB — POTASSIUM: Potassium: 3.4 mmol/L — ABNORMAL LOW (ref 3.5–5.1)

## 2019-02-20 LAB — SARS CORONAVIRUS 2 (TAT 6-24 HRS): SARS Coronavirus 2: NEGATIVE

## 2019-02-23 MED ORDER — SODIUM CHLORIDE 0.9 % IV SOLN
2.0000 g | INTRAVENOUS | Status: AC
  Administered 2019-02-24: 2 g via INTRAVENOUS

## 2019-02-24 ENCOUNTER — Inpatient Hospital Stay: Payer: Medicaid Other | Admitting: Anesthesiology

## 2019-02-24 ENCOUNTER — Other Ambulatory Visit: Payer: Self-pay

## 2019-02-24 ENCOUNTER — Inpatient Hospital Stay
Admission: RE | Admit: 2019-02-24 | Discharge: 2019-02-26 | DRG: 742 | Disposition: A | Discharge status: Home / Self Care | Payer: Medicaid Other | Attending: Obstetrics & Gynecology | Admitting: Obstetrics & Gynecology

## 2019-02-24 ENCOUNTER — Encounter
Admission: RE | Disposition: A | Discharge status: Home / Self Care | Payer: Self-pay | Source: Home / Self Care | Attending: Obstetrics & Gynecology

## 2019-02-24 DIAGNOSIS — Z79899 Other long term (current) drug therapy: Secondary | ICD-10-CM | POA: Diagnosis not present

## 2019-02-24 DIAGNOSIS — D259 Leiomyoma of uterus, unspecified: Secondary | ICD-10-CM | POA: Diagnosis present

## 2019-02-24 DIAGNOSIS — K219 Gastro-esophageal reflux disease without esophagitis: Secondary | ICD-10-CM | POA: Diagnosis present

## 2019-02-24 DIAGNOSIS — N870 Mild cervical dysplasia: Secondary | ICD-10-CM

## 2019-02-24 DIAGNOSIS — D251 Intramural leiomyoma of uterus: Secondary | ICD-10-CM | POA: Diagnosis present

## 2019-02-24 DIAGNOSIS — I10 Essential (primary) hypertension: Secondary | ICD-10-CM | POA: Diagnosis present

## 2019-02-24 DIAGNOSIS — Z9851 Tubal ligation status: Secondary | ICD-10-CM

## 2019-02-24 DIAGNOSIS — N946 Dysmenorrhea, unspecified: Secondary | ICD-10-CM | POA: Diagnosis present

## 2019-02-24 DIAGNOSIS — D219 Benign neoplasm of connective and other soft tissue, unspecified: Secondary | ICD-10-CM | POA: Diagnosis present

## 2019-02-24 DIAGNOSIS — D250 Submucous leiomyoma of uterus: Secondary | ICD-10-CM

## 2019-02-24 DIAGNOSIS — D252 Subserosal leiomyoma of uterus: Secondary | ICD-10-CM

## 2019-02-24 DIAGNOSIS — N920 Excessive and frequent menstruation with regular cycle: Secondary | ICD-10-CM | POA: Diagnosis present

## 2019-02-24 DIAGNOSIS — Z6841 Body Mass Index (BMI) 40.0 and over, adult: Secondary | ICD-10-CM

## 2019-02-24 HISTORY — PX: TOTAL LAPAROSCOPIC HYSTERECTOMY WITH SALPINGECTOMY: SHX6742

## 2019-02-24 LAB — POCT PREGNANCY, URINE: Preg Test, Ur: NEGATIVE

## 2019-02-24 SURGERY — HYSTERECTOMY, TOTAL, LAPAROSCOPIC, WITH SALPINGECTOMY
Anesthesia: General

## 2019-02-24 MED ORDER — OXYCODONE-ACETAMINOPHEN 5-325 MG PO TABS
1.0000 | ORAL_TABLET | ORAL | Status: DC | PRN
  Administered 2019-02-24 – 2019-02-25 (×4): 2 via ORAL
  Administered 2019-02-25: 1 via ORAL
  Administered 2019-02-25: 2 via ORAL
  Administered 2019-02-25: 1 via ORAL
  Filled 2019-02-24: qty 1
  Filled 2019-02-24 (×2): qty 2
  Filled 2019-02-24 (×2): qty 1
  Filled 2019-02-24: qty 2
  Filled 2019-02-24: qty 1
  Filled 2019-02-24 (×2): qty 2

## 2019-02-24 MED ORDER — HEMOSTATIC AGENTS (NO CHARGE) OPTIME
TOPICAL | Status: DC | PRN
  Administered 2019-02-24: 1 via TOPICAL

## 2019-02-24 MED ORDER — SENNOSIDES-DOCUSATE SODIUM 8.6-50 MG PO TABS
1.0000 | ORAL_TABLET | Freq: Every evening | ORAL | Status: DC | PRN
  Filled 2019-02-24: qty 1

## 2019-02-24 MED ORDER — FENTANYL CITRATE (PF) 100 MCG/2ML IJ SOLN
INTRAMUSCULAR | Status: AC
  Filled 2019-02-24: qty 2

## 2019-02-24 MED ORDER — SODIUM CHLORIDE 0.9 % IV SOLN
INTRAVENOUS | Status: AC
  Filled 2019-02-24: qty 2

## 2019-02-24 MED ORDER — BISACODYL 10 MG RE SUPP
10.0000 mg | Freq: Every day | RECTAL | Status: DC | PRN
  Filled 2019-02-24: qty 1

## 2019-02-24 MED ORDER — PROMETHAZINE HCL 25 MG/ML IJ SOLN: 6.2500 mg | INTRAMUSCULAR | Status: DC | PRN

## 2019-02-24 MED ORDER — BUPIVACAINE LIPOSOME 1.3 % IJ SUSP
INTRAMUSCULAR | Status: AC
  Filled 2019-02-24: qty 20

## 2019-02-24 MED ORDER — SIMETHICONE 80 MG PO CHEW
80.0000 mg | CHEWABLE_TABLET | Freq: Four times a day (QID) | ORAL | Status: DC | PRN
  Administered 2019-02-24 – 2019-02-25 (×2): 80 mg via ORAL
  Filled 2019-02-24 (×3): qty 1

## 2019-02-24 MED ORDER — LACTATED RINGERS IV SOLN: INTRAVENOUS | Status: DC

## 2019-02-24 MED ORDER — FENTANYL CITRATE (PF) 100 MCG/2ML IJ SOLN
INTRAMUSCULAR | Status: DC | PRN
  Administered 2019-02-24 (×4): 50 ug via INTRAVENOUS

## 2019-02-24 MED ORDER — PROPOFOL 10 MG/ML IV BOLUS
INTRAVENOUS | Status: AC
  Filled 2019-02-24: qty 40

## 2019-02-24 MED ORDER — OXYCODONE HCL 5 MG PO TABS: 5.0000 mg | ORAL_TABLET | Freq: Once | ORAL | Status: DC | PRN

## 2019-02-24 MED ORDER — KETOROLAC TROMETHAMINE 30 MG/ML IJ SOLN
30.0000 mg | Freq: Four times a day (QID) | INTRAMUSCULAR | Status: AC
  Administered 2019-02-24 – 2019-02-25 (×3): 30 mg via INTRAVENOUS
  Filled 2019-02-24: qty 1

## 2019-02-24 MED ORDER — ROCURONIUM BROMIDE 100 MG/10ML IV SOLN
INTRAVENOUS | Status: DC | PRN
  Administered 2019-02-24: 10 mg via INTRAVENOUS
  Administered 2019-02-24: 50 mg via INTRAVENOUS

## 2019-02-24 MED ORDER — ACETAMINOPHEN NICU IV SYRINGE 10 MG/ML
INTRAVENOUS | Status: AC
  Filled 2019-02-24: qty 1

## 2019-02-24 MED ORDER — BUPIVACAINE HCL (PF) 0.5 % IJ SOLN
INTRAMUSCULAR | Status: DC | PRN
  Administered 2019-02-24: 30 mL

## 2019-02-24 MED ORDER — IBUPROFEN 800 MG PO TABS
800.0000 mg | ORAL_TABLET | Freq: Four times a day (QID) | ORAL | Status: DC
  Administered 2019-02-25 – 2019-02-26 (×5): 800 mg via ORAL
  Filled 2019-02-24 (×6): qty 1

## 2019-02-24 MED ORDER — BUPIVACAINE HCL (PF) 0.5 % IJ SOLN
INTRAMUSCULAR | Status: AC
  Filled 2019-02-24: qty 30

## 2019-02-24 MED ORDER — DEXAMETHASONE SODIUM PHOSPHATE 10 MG/ML IJ SOLN
INTRAMUSCULAR | Status: DC | PRN
  Administered 2019-02-24: 10 mg via INTRAVENOUS

## 2019-02-24 MED ORDER — MIDAZOLAM HCL 2 MG/2ML IJ SOLN
INTRAMUSCULAR | Status: AC
  Filled 2019-02-24: qty 2

## 2019-02-24 MED ORDER — HYDROCHLOROTHIAZIDE 12.5 MG PO CAPS
12.5000 mg | ORAL_CAPSULE | Freq: Every day | ORAL | Status: DC
  Administered 2019-02-24 – 2019-02-26 (×3): 12.5 mg via ORAL
  Filled 2019-02-24 (×3): qty 1

## 2019-02-24 MED ORDER — ONDANSETRON HCL 4 MG PO TABS: 4.0000 mg | ORAL_TABLET | Freq: Four times a day (QID) | ORAL | Status: DC | PRN

## 2019-02-24 MED ORDER — ACETAMINOPHEN 10 MG/ML IV SOLN
INTRAVENOUS | Status: DC | PRN
  Administered 2019-02-24: 1000 mg via INTRAVENOUS

## 2019-02-24 MED ORDER — KETAMINE HCL 50 MG/ML IJ SOLN
INTRAMUSCULAR | Status: DC | PRN
  Administered 2019-02-24: 50 mg via INTRAMUSCULAR

## 2019-02-24 MED ORDER — MORPHINE SULFATE (PF) 4 MG/ML IV SOLN
1.0000 mg | INTRAVENOUS | Status: DC | PRN
  Filled 2019-02-24: qty 1

## 2019-02-24 MED ORDER — ONDANSETRON HCL 4 MG/2ML IJ SOLN: 4.0000 mg | Freq: Four times a day (QID) | INTRAMUSCULAR | Status: DC | PRN

## 2019-02-24 MED ORDER — KETOROLAC TROMETHAMINE 30 MG/ML IJ SOLN
30.0000 mg | Freq: Four times a day (QID) | INTRAMUSCULAR | Status: DC
  Filled 2019-02-24 (×2): qty 1

## 2019-02-24 MED ORDER — SCOPOLAMINE 1 MG/3DAYS TD PT72
1.0000 | MEDICATED_PATCH | TRANSDERMAL | Status: DC
  Administered 2019-02-24: 1.5 mg via TRANSDERMAL

## 2019-02-24 MED ORDER — BUPIVACAINE LIPOSOME 1.3 % IJ SUSP
INTRAMUSCULAR | Status: DC | PRN
  Administered 2019-02-24: 20 mL

## 2019-02-24 MED ORDER — ACETAMINOPHEN 325 MG PO TABS: 650.0000 mg | ORAL_TABLET | ORAL | Status: DC | PRN

## 2019-02-24 MED ORDER — PROPOFOL 10 MG/ML IV BOLUS
INTRAVENOUS | Status: DC | PRN
  Administered 2019-02-24: 150 mg via INTRAVENOUS

## 2019-02-24 MED ORDER — TRAZODONE HCL 50 MG PO TABS
50.0000 mg | ORAL_TABLET | Freq: Every evening | ORAL | Status: DC | PRN
  Filled 2019-02-24: qty 1

## 2019-02-24 MED ORDER — SUGAMMADEX SODIUM 200 MG/2ML IV SOLN
INTRAVENOUS | Status: DC | PRN
  Administered 2019-02-24: 200 mg via INTRAVENOUS

## 2019-02-24 MED ORDER — DOCUSATE SODIUM 100 MG PO CAPS
100.0000 mg | ORAL_CAPSULE | Freq: Two times a day (BID) | ORAL | Status: DC
  Administered 2019-02-24 – 2019-02-26 (×4): 100 mg via ORAL
  Filled 2019-02-24 (×6): qty 1

## 2019-02-24 MED ORDER — SUGAMMADEX SODIUM 200 MG/2ML IV SOLN
INTRAVENOUS | Status: AC
  Filled 2019-02-24: qty 2

## 2019-02-24 MED ORDER — OXYCODONE HCL 5 MG/5ML PO SOLN: 5.0000 mg | Freq: Once | ORAL | Status: DC | PRN

## 2019-02-24 MED ORDER — LIDOCAINE HCL (CARDIAC) PF 100 MG/5ML IV SOSY
PREFILLED_SYRINGE | INTRAVENOUS | Status: DC | PRN
  Administered 2019-02-24: 60 mg via INTRAVENOUS

## 2019-02-24 MED ORDER — MIDAZOLAM HCL 2 MG/2ML IJ SOLN
INTRAMUSCULAR | Status: DC | PRN
  Administered 2019-02-24: 2 mg via INTRAVENOUS

## 2019-02-24 MED ORDER — FENTANYL CITRATE (PF) 100 MCG/2ML IJ SOLN
25.0000 ug | INTRAMUSCULAR | Status: DC | PRN
  Administered 2019-02-24 (×4): 25 ug via INTRAVENOUS

## 2019-02-24 MED ORDER — KETOROLAC TROMETHAMINE 30 MG/ML IJ SOLN
INTRAMUSCULAR | Status: DC | PRN
  Administered 2019-02-24: 30 mg via INTRAVENOUS

## 2019-02-24 MED ORDER — KETAMINE HCL 50 MG/ML IJ SOLN
INTRAMUSCULAR | Status: AC
  Filled 2019-02-24: qty 10

## 2019-02-24 MED ORDER — ONDANSETRON HCL 4 MG/2ML IJ SOLN
INTRAMUSCULAR | Status: DC | PRN
  Administered 2019-02-24: 4 mg via INTRAVENOUS

## 2019-02-24 MED ORDER — SCOPOLAMINE 1 MG/3DAYS TD PT72
MEDICATED_PATCH | TRANSDERMAL | Status: AC
  Filled 2019-02-24: qty 1

## 2019-02-24 SURGICAL SUPPLY — 44 items
BLADE SURG SZ11 CARB STEEL (BLADE) ×2 IMPLANT
CANISTER SUCT 1200ML W/VALVE (MISCELLANEOUS) ×2 IMPLANT
CHLORAPREP W/TINT 26 (MISCELLANEOUS) ×2 IMPLANT
COVER WAND RF STERILE (DRAPES) ×2 IMPLANT
DERMABOND ADVANCED (GAUZE/BANDAGES/DRESSINGS)
DERMABOND ADVANCED .7 DNX12 (GAUZE/BANDAGES/DRESSINGS) ×1 IMPLANT
DRAPE LAPAROTOMY 100X77 ABD (DRAPES) ×1 IMPLANT
DRAPE LAPAROTOMY TRNSV 106X77 (MISCELLANEOUS) ×1 IMPLANT
DRSG TEGADERM 2-3/8X2-3/4 SM (GAUZE/BANDAGES/DRESSINGS) IMPLANT
ELECT BLADE 6.5 EXT (BLADE) ×1 IMPLANT
GLOVE BIO SURGEON STRL SZ8 (GLOVE) ×10 IMPLANT
GLOVE INDICATOR 8.0 STRL GRN (GLOVE) ×2 IMPLANT
GOWN STRL REUS W/ TWL LRG LVL3 (GOWN DISPOSABLE) ×1 IMPLANT
GOWN STRL REUS W/ TWL XL LVL3 (GOWN DISPOSABLE) ×2 IMPLANT
GOWN STRL REUS W/TWL LRG LVL3 (GOWN DISPOSABLE) ×1
GOWN STRL REUS W/TWL XL LVL3 (GOWN DISPOSABLE) ×2
HEMOSTAT SURGICEL 2X3 (HEMOSTASIS) ×1 IMPLANT
IV LACTATED RINGERS 1000ML (IV SOLUTION) ×2 IMPLANT
KIT PINK PAD W/HEAD ARE REST (MISCELLANEOUS) ×2
KIT PINK PAD W/HEAD ARM REST (MISCELLANEOUS) ×1 IMPLANT
KIT TURNOVER CYSTO (KITS) ×1 IMPLANT
LABEL OR SOLS (LABEL) ×2 IMPLANT
NS IRRIG 500ML POUR BTL (IV SOLUTION) ×2 IMPLANT
PACK BASIN MAJOR ARMC (MISCELLANEOUS) ×1 IMPLANT
PAD OB MATERNITY 4.3X12.25 (PERSONAL CARE ITEMS) ×2 IMPLANT
PAD PREP 24X41 OB/GYN DISP (PERSONAL CARE ITEMS) ×2 IMPLANT
SET CYSTO W/LG BORE CLAMP LF (SET/KITS/TRAYS/PACK) ×1 IMPLANT
SPONGE GAUZE 2X2 8PLY STRL LF (GAUZE/BANDAGES/DRESSINGS) IMPLANT
SUT ETHIBOND 0 (SUTURE) ×2 IMPLANT
SUT MAXON 0 T 12 3 (SUTURE) ×1 IMPLANT
SUT MAXON ABS #0 GS21 30IN (SUTURE) ×1 IMPLANT
SUT PDS AB 1 TP1 96 (SUTURE) ×1 IMPLANT
SUT PLAIN 2 0 XLH (SUTURE) ×1 IMPLANT
SUT VIC AB 0 CT1 18XCR BRD 8 (SUTURE) IMPLANT
SUT VIC AB 0 CT1 27 (SUTURE) ×2
SUT VIC AB 0 CT1 27XCR 8 STRN (SUTURE) IMPLANT
SUT VIC AB 0 CT1 36 (SUTURE) ×2 IMPLANT
SUT VIC AB 0 CT1 8-18 (SUTURE)
SUT VIC AB 4-0 FS2 27 (SUTURE) ×2 IMPLANT
SUT VIC AB 4-0 PS2 18 (SUTURE) ×3 IMPLANT
SYR 10ML LL (SYRINGE) ×2 IMPLANT
SYR 20ML LL LF (SYRINGE) ×1 IMPLANT
SYR 50ML LL SCALE MARK (SYRINGE) ×2 IMPLANT
TRAY FOLEY MTR SLVR 16FR STAT (SET/KITS/TRAYS/PACK) ×1 IMPLANT

## 2019-02-24 NOTE — Progress Notes (Signed)
15 minute call to floor. 

## 2019-02-24 NOTE — Transfer of Care (Signed)
Immediate Anesthesia Transfer of Care Note  Patient: Christie Alexander  Procedure(s) Performed: TOTAL ABDOMINAL HYSTERECTOMY WITH BILATERAL SALPINGECTOMY (N/A )  Patient Location: PACU  Anesthesia Type:General  Level of Consciousness: awake, alert  and oriented  Airway & Oxygen Therapy: Patient Spontanous Breathing and Patient connected to face mask oxygen  Post-op Assessment: Report given to RN and Post -op Vital signs reviewed and stable  Post vital signs: Reviewed and stable  Last Vitals:  Vitals Value Taken Time  BP 158/96 02/24/19 1212  Temp 36.3 C 02/24/19 1212  Pulse 109 02/24/19 1218  Resp 25 02/24/19 1218  SpO2 92 % 02/24/19 1218  Vitals shown include unvalidated device data.  Last Pain:  Vitals:   02/24/19 1212  TempSrc:   PainSc: Asleep         Complications: No apparent anesthesia complications

## 2019-02-24 NOTE — Op Note (Signed)
  Operative Note  02/24/2019  PRE-OP DIAGNOSIS:  Fibroids D21.9 Menorrhagia with regular cycle N92.0 Dysmenorrhea N94.6   POST-OP DIAGNOSIS: same   PROCEDURE: Procedure(s): TOTAL ABDOMINAL HYSTERECTOMY WITH BILATERAL SALPINGECTOMY   SURGEON: Barnett Applebaum, MD, FACOG  ASSISTANT: Dr Marisue Brooklyn, No other capable assistant available, in surgery requiring high level assistant.  ANESTHESIA: Choice   ESTIMATED BLOOD LOSS: 400 mL  SPECIMENS:  Cervix, uterus, bilateral tubes.  COMPLICATIONS: None  DISPOSITION: PACU - hemodynamically stable.  CONDITION: stable  FINDINGS: Exam under anesthesia revealed globular enlarged uterus to 1 cm below Umbilicus. Intrabdominal survey revealed same, normal ovaries.  PROCEDURE IN DETAIL: After informed consent was obtained, the patient was taken to the operating room where general endotracheal anesthesia was performed without difficulty. The patient was positioned in the supine position and the usual precautions taken with her arms in arm boards bilaterally.She was prepped and draped in normal sterile fashion. Time-out was performed and a Foley catheter was placed into the bladder.  A vertical midline incision incision was made and carried down to the underlying fascia. The fascia was dissected bilaterally using mayo scissors, and then the rectus fascia was dissected away from the muscle and separated in the midline. The peritoneum was then entered. Bladder is inferiorally retracted and the bowel was freed up and packed gently, no abdominal retractor was placed yet retraction with richardson retractors helped facilitate the case.  Round ligaments were then suture ligated and divided on each side with electrocautery and the retroperitoneal space was opened bilaterally. The ureters were identified and preserved. The uterine-ovarian ligaments were skeletonized, divided, then suture ligated.  Fallopian tubes were clamped, sutured, and excised.  Preservation of both  ovaries and their blood supply was achieved. The bladder flap was created  and the bladder was dissected down off the lower uterine segment and cervix. The uterine arteries were then skeletonized bilaterally clamped, cut, and divided, then suture ligated. The cardinal ligaments were then clamped cut and divided and suture ligated as well. The vaginal angles were then clamped with hysterectomy clamps, cut and suture ligated and tagged. The specimen was then amputated from the vagina using Mayo scissors and the vaginal walls were grasped with Kocher clamps. The vaginal cuff was then closed with 0 Vicryl suture in a figure-of-eight fashion with careful attention to include the vaginal cuff angles and the vaginal mucosa within the closure and avoid the bladder.   After the abdomen was irrigated and all pedicles felt to be hemostatic the laparotomy packs were removed and the fascia was then reapproximated in a running suture with 0 PDS.   The subcutaneous space was then irrigated, and hemostasis assured using electrocautery.  Subcutaneous closure with plain gut suture to lessen dead space was done. The skin was closed with surgical clips.  Sponge, lap, needle and instrument counts were correct x2 at the end of the procedure. Patient goes to recovery room in stable condition.  Barnett Applebaum, MD, [facility], [facility] 02/24/2019  12:06 PM

## 2019-02-24 NOTE — Interval H&P Note (Signed)
History and Physical Interval Note:  02/24/2019 9:07 AM  Christie Alexander  has presented today for surgery, with the diagnosis of Fibroids D21.9 Menorrhagia with regular cycle N92.0, Dysmenorrhea N94.6.  The various methods of treatment have been discussed with the patient and family. After consideration of risks, benefits and other options for treatment, the patient has consented to  Procedure(s): TOTAL ABDOMINAL HYSTERECTOMY WITH BILATERAL SALPINGECTOMY (N/A) as a surgical intervention.  The patient's history has been reviewed, patient examined, no change in status, stable for surgery.  I have reviewed the patient's chart and labs.  Questions were answered to the patient's satisfaction.     Hoyt Koch

## 2019-02-24 NOTE — Anesthesia Procedure Notes (Signed)
Procedure Name: Intubation Date/Time: 02/24/2019 9:58 AM Performed by: Debe Coder, CRNA Pre-anesthesia Checklist: Patient identified, Emergency Drugs available, Suction available and Patient being monitored Patient Re-evaluated:Patient Re-evaluated prior to induction Oxygen Delivery Method: Circle system utilized Preoxygenation: Pre-oxygenation with 100% oxygen Induction Type: IV induction Ventilation: Mask ventilation without difficulty Laryngoscope Size: Mac and 3 Grade View: Grade I Tube type: Oral Tube size: 6.5 mm Number of attempts: 1 Airway Equipment and Method: Stylet and Oral airway Placement Confirmation: ETT inserted through vocal cords under direct vision,  positive ETCO2 and breath sounds checked- equal and bilateral Secured at: 20 cm Tube secured with: Tape Dental Injury: Teeth and Oropharynx as per pre-operative assessment

## 2019-02-24 NOTE — Anesthesia Preprocedure Evaluation (Addendum)
Anesthesia Evaluation  Patient identified by MRN, date of birth, ID band Patient awake    Reviewed: Allergy & Precautions, H&P , NPO status , Patient's Chart, lab work & pertinent test results  Airway Mallampati: II  TM Distance: >3 FB Neck ROM: full    Dental  (+) Chipped   Pulmonary neg pulmonary ROS,           Cardiovascular hypertension,      Neuro/Psych PSYCHIATRIC DISORDERS Depression negative neurological ROS     GI/Hepatic Neg liver ROS, GERD  Controlled,  Endo/Other  Morbid obesity  Renal/GU      Musculoskeletal   Abdominal   Peds  Hematology  (+) Blood dyscrasia, anemia ,   Anesthesia Other Findings Past Medical History: No date: Anemia No date: Depression No date: GERD (gastroesophageal reflux disease)     Comment:  on occasion No date: Hypertension No date: Uterine fibroid  Past Surgical History: 2009: TUBAL LIGATION     Reproductive/Obstetrics negative OB ROS                            Anesthesia Physical Anesthesia Plan  ASA: III  Anesthesia Plan: General ETT   Post-op Pain Management:    Induction:   PONV Risk Score and Plan: Ondansetron, Dexamethasone, Midazolam, Treatment may vary due to age or medical condition and Scopolamine patch - Pre-op  Airway Management Planned:   Additional Equipment:   Intra-op Plan:   Post-operative Plan:   Informed Consent: I have reviewed the patients History and Physical, chart, labs and discussed the procedure including the risks, benefits and alternatives for the proposed anesthesia with the patient or authorized representative who has indicated his/her understanding and acceptance.     Dental Advisory Given  Plan Discussed with: Anesthesiologist  Anesthesia Plan Comments:        Anesthesia Quick Evaluation

## 2019-02-24 NOTE — Discharge Instructions (Signed)
Abdominal Hysterectomy, Care After This sheet gives you information about how to care for yourself after your procedure. Your doctor may also give you more specific instructions. If you have problems or questions, contact your doctor. Follow these instructions at home: Bathing  Do not take baths, swim, or use a hot tub until your doctor says it is okay. Ask your doctor if you can take showers. You may only be allowed to take sponge baths for bathing.  Keep the bandage (dressing) dry until your doctor says it can be taken off. Surgical cut (incision) care      Follow instructions from your doctor about how to take care of your cut from surgery. Make sure you: ? Wash your hands with soap and water before you change your bandage (dressing). If you cannot use soap and water, use hand sanitizer. ? Change your bandage as told by your doctor. ? Leave stitches (sutures), skin glue, or skin tape (adhesive) strips in place. They may need to stay in place for 2 weeks or longer. If tape strips get loose and curl up, you may trim the loose edges. Do not remove tape strips completely unless your doctor says it is okay.  Check your surgical cut area every day for signs of infection. Check for: ? Redness, swelling, or pain. ? Fluid or blood. ? Warmth. ? Pus or a bad smell. Activity  Do gentle, daily exercise as told by your doctor. You may be told to take short walks every day and go farther each time.  Do not lift anything that is heavier than 10 lb (4.5 kg), or the limit that your doctor tells you, until he or she says that it is safe.  Do not drive or use heavy machinery while taking prescription pain medicine.  Do not drive for 24 hours if you were given a medicine to help you relax (sedative).  Follow your doctor's advice about exercise, driving, and general activities. Ask your doctor what activities are safe for you. Lifestyle   Do not douche, use tampons, or have sex for at least 6 weeks  or as told by your doctor.  Do not drink alcohol until your doctor says it is okay.  Drink enough fluid to keep your pee (urine) clear or pale yellow.  Try to have someone at home with you for the first 1-2 weeks to help.  Do not use any products that contain nicotine or tobacco, such as cigarettes and e-cigarettes. These can slow down healing. If you need help quitting, ask your doctor. General instructions  Take over-the-counter and prescription medicines only as told by your doctor.  Do not take aspirin or ibuprofen. These medicines can cause bleeding.  To prevent or treat constipation while you are taking prescription pain medicine, your doctor may suggest that you: ? Drink enough fluid to keep your urine clear or pale yellow. ? Take over-the-counter or prescription medicines. ? Eat foods that are high in fiber, such as:  Fresh fruits and vegetables.  Whole grains.  Beans. ? Limit foods that are high in fat and processed sugars, such as fried and sweet foods.  Keep all follow-up visits as told by your doctor. This is important. Contact a doctor if:  You have chills or fever.  You have redness, swelling, or pain around your cut.  You have fluid or blood coming from your cut.  Your cut feels warm to the touch.  You have pus or a bad smell coming from your cut.    Your cut breaks open.  You feel dizzy or light-headed.  You have pain or bleeding when you pee.  You keep having watery poop (diarrhea).  You keep feeling sick to your stomach (nauseous) or keep throwing up (vomiting).  You have unusual fluid (discharge) coming from your vagina.  You have a rash.  You have a reaction to your medicine.  Your pain medicine does not help. Get help right away if:  You have a fever and your symptoms get worse all of a sudden.  You have very bad belly (abdominal) pain.  You are short of breath.  You pass out (faint).  You have pain, swelling, or redness of your  leg.  You bleed a lot from your vagina and notice clumps of blood (clots). Summary  Do not take baths, swim, or use a hot tub until your doctor says it is okay. Ask your doctor if you can take showers. You may only be allowed to take sponge baths for bathing.  Follow your doctor's advice about exercise, driving, and general activities. Ask your doctor what activities are safe for you.  Do not lift anything that is heavier than 10 lb (4.5 kg), or the limit that your doctor tells you, until he or she says that it is safe.  Try to have someone at home with you for the first 1-2 weeks to help. This information is not intended to replace advice given to you by your health care provider. Make sure you discuss any questions you have with your health care provider. Document Revised: 02/20/2018 Document Reviewed: 12/07/2015 Elsevier Patient Education  2020 [employer]  

## 2019-02-25 LAB — HEMOGLOBIN: Hemoglobin: 8.7 g/dL — ABNORMAL LOW (ref 12.0–15.0)

## 2019-02-25 NOTE — Progress Notes (Signed)
1 Day Post-Op Procedure(s) (LRB): TOTAL ABDOMINAL HYSTERECTOMY WITH BILATERAL SALPINGECTOMY (N/A)  Subjective: Patient reports incisional pain and tolerating PO.    Objective: I have reviewed patient's vital signs, intake and output, medications and labs.  Abd: Min T, ND Incision: clean, dry and intact Extr: no calf T, no edema  Hemoglobin  Result Value Ref Range   Hemoglobin 8.7 (L) 12.0 - 15.0 g/dL   Assessment:FIBROIDS s/p Procedure(s): TOTAL ABDOMINAL HYSTERECTOMY WITH BILATERAL SALPINGECTOMY (N/A): stable  Plan: Advance diet Encourage ambulation Advance to PO medication Foley out this am.  LOS: 1 day    Hoyt Koch 02/25/2019, 8:32 AM

## 2019-02-25 NOTE — Anesthesia Postprocedure Evaluation (Signed)
Anesthesia Post Note  Patient: Christie Alexander  Procedure(s) Performed: TOTAL ABDOMINAL HYSTERECTOMY WITH BILATERAL SALPINGECTOMY (N/A )  Patient location during evaluation: PACU Anesthesia Type: General Level of consciousness: awake and alert Pain management: pain level controlled Vital Signs Assessment: post-procedure vital signs reviewed and stable Respiratory status: spontaneous breathing, nonlabored ventilation and respiratory function stable Cardiovascular status: blood pressure returned to baseline and stable Postop Assessment: no apparent nausea or vomiting Anesthetic complications: no     Last Vitals:  Vitals:   02/25/19 0300 02/25/19 0758  BP: 131/72 128/71  Pulse: 93 89  Resp: 18 18  Temp: 37.1 C 36.9 C  SpO2: 99% 100%    Last Pain:  Vitals:   02/25/19 0758  TempSrc: Oral  PainSc:                  [name]

## 2019-02-26 MED ORDER — TRAMADOL HCL 50 MG PO TABS
50.0000 mg | ORAL_TABLET | Freq: Four times a day (QID) | ORAL | Status: DC | PRN
  Administered 2019-02-26: 50 mg via ORAL
  Filled 2019-02-26: qty 1

## 2019-02-26 MED ORDER — TRAMADOL HCL 50 MG PO TABS: 50.0000 mg | ORAL_TABLET | Freq: Four times a day (QID) | ORAL | 0 refills | Status: AC | PRN

## 2019-02-26 MED ORDER — IBUPROFEN 800 MG PO TABS: 800.0000 mg | ORAL_TABLET | Freq: Four times a day (QID) | ORAL | 0 refills | Status: AC

## 2019-02-26 NOTE — Discharge Summary (Signed)
Gynecology Physician Postoperative Discharge Summary  Patient ID: Christie Alexander MRN: [id number] DOB/AGE: 45-06-1975 45 y.o.  Admit Date: 02/24/2019 Discharge Date: 02/26/2019  Preoperative Diagnoses: Fibroids with pain and bleeding  Procedures: Procedure(s) (LRB): TOTAL ABDOMINAL HYSTERECTOMY WITH BILATERAL SALPINGECTOMY (N/A)  Significant Labs: CBC Latest Ref Rng & Units 02/25/2019 02/20/2019 07/14/2017  WBC 4.0 - 10.5 K/uL - 6.2 5.5  Hemoglobin 12.0 - 15.0 g/dL 8.7(L) 10.8(L) 9.9(L)  Hematocrit 36.0 - 46.0 % - 34.0(L) 30.5(L)  Platelets 150 - 400 K/uL - 337 368    Hospital Course:  Christie Alexander is a 44 y.o. [id number]  admitted for scheduled surgery.  She underwent the procedures as mentioned above, her operation was uncomplicated. For further details about surgery, please refer to the operative report. Patient had an uncomplicated postoperative course. By time of discharge on POD#2, her pain was controlled on oral pain medications; she was ambulating, voiding without difficulty, tolerating regular diet and passing flatus. She was deemed stable for discharge to home.   Discharge Exam: Blood pressure 136/76, pulse 74, temperature 98.4 F (36.9 C), temperature source Oral, resp. rate 18, height 4\' 11"  (1.499 m), weight 91.6 kg, last menstrual period 02/19/2019, SpO2 100 %. General appearance: alert and no distress  Resp: clear to auscultation bilaterally  Cardio: regular rate and rhythm  GI: soft, non-tender; bowel sounds normal; no masses, no organomegaly.  Incision: C/D/I, no erythema, no drainage noted Pelvic: scant blood on pad  Extremities: extremities normal, atraumatic, no cyanosis or edema and Homans sign is negative, no sign of DVT  Discharged Condition: Stable  Disposition: Discharge disposition: 01-Home or Self Care       Discharge Instructions    Call MD for:  persistant nausea and vomiting   Complete by: As directed    Call MD for:  persistant nausea and vomiting    Complete by: As directed    Call MD for:  redness, tenderness, or signs of infection (pain, swelling, redness, odor or green/yellow discharge around incision site)   Complete by: As directed    Call MD for:  redness, tenderness, or signs of infection (pain, swelling, redness, odor or green/yellow discharge around incision site)   Complete by: As directed    Call MD for:  severe uncontrolled pain   Complete by: As directed    Call MD for:  severe uncontrolled pain   Complete by: As directed    Call MD for:  temperature >100.4   Complete by: As directed    Call MD for:  temperature >100.4   Complete by: As directed    Change dressing (specify)   Complete by: As directed    Dressing change: remove any dressings tomorrow   Change dressing (specify)   Complete by: As directed    Dressing change: remove any dressings tomorrow   Diet general   Complete by: As directed    Diet general   Complete by: As directed    Discharge instructions   Complete by: As directed    Resume activities according to discharge instruction sheets   Discharge instructions   Complete by: As directed    Resume activities according to discharge instruction sheets   Increase activity slowly   Complete by: As directed    Increase activity slowly   Complete by: As directed      Allergies as of 02/26/2019      Reactions   Orange (diagnostic) Itching   Tomato Itching    Med Rec must be  completed prior to using this [facility]        Discharge Care Instructions  (From admission, onward)         Start     Ordered   02/26/19 0000  Change dressing (specify)       02/26/19 0718         Follow-up Information    Gae Dry, MD. Go in 1 week.   Specialty: Obstetrics and Gynecology Why: For Post Op, As Scheduled Contact information: [address] [place] [address] [phone]           Barnett Applebaum, MD

## 2019-02-26 NOTE — Progress Notes (Signed)
Patient discharged home with brother. Discharge instructions and prescriptions given and reviewed with patient. Patient verbalized understanding. Incision cleaning kit given. Staple removal kit given to take to f/u appointment next week. Escorted out by staff.

## 2019-02-27 LAB — SURGICAL PATHOLOGY

## 2019-03-04 ENCOUNTER — Ambulatory Visit (INDEPENDENT_AMBULATORY_CARE_PROVIDER_SITE_OTHER): Payer: Medicaid Other | Admitting: Obstetrics & Gynecology

## 2019-03-04 ENCOUNTER — Other Ambulatory Visit: Payer: Self-pay

## 2019-03-04 ENCOUNTER — Encounter: Payer: Self-pay | Admitting: Obstetrics & Gynecology

## 2019-03-04 VITALS — BP 130/80 | Ht 59.0 in | Wt 191.0 lb

## 2019-03-04 DIAGNOSIS — Z90710 Acquired absence of both cervix and uterus: Secondary | ICD-10-CM

## 2019-03-04 DIAGNOSIS — Z9889 Other specified postprocedural states: Secondary | ICD-10-CM

## 2019-03-04 DIAGNOSIS — Z4802 Encounter for removal of sutures: Secondary | ICD-10-CM

## 2019-03-04 NOTE — Progress Notes (Signed)
  Postoperative Follow-up Patient presents post op from total abdominal hysterectomy for fibroids, 1 week ago. Path: DIAGNOSIS:  A. UTERUS WITH CERVIX AND BILATERAL FALLOPIAN TUBES; TOTAL HYSTERECTOMY  WITH BILATERAL SALPINGECTOMY:  - UTERINE CERVIX:    - LOW-GRADE SQUAMOUS INTRAEPITHELIAL LESION (CIN-1, MILD  DYSPLASIA).    - NEGATIVE FOR HIGH-GRADE SQUAMOUS INTRAEPITHELIAL LESION AND  MALIGNANCY.  - ENDOMETRIUM:    - WEAKLY PROLIFERATIVE ENDOMETRIUM.    - MINUTE BENIGN ENDOMETRIAL POLYP.    - NEGATIVE FOR ATYPICAL HYPERPLASIA/EIN AND MALIGNANCY.  - MYOMETRIUM:    - LEIOMYOMATA UTERI, WITH FOCAL HYALINIZATION AND DYSTROPHIC  CALCIFICATION.    - NEGATIVE FOR FEATURES OF MALIGNANCY.  - UTERINE SEROSA:    - MILD FIBROUS ADHESIONS.  - FALLOPIAN TUBES:    - NO SIGNIFICANT HISTOPATHOLOGIC CHANGE. Subjective: Patient reports some improvement in her preop symptoms. Eating a regular diet without difficulty. Pain is controlled with current analgesics. Medications being used: ibuprofen (OTC).  Activity: sedentary. Patient reports additional symptom's since surgery of None.  Objective: BP 130/80   Ht 4\' 11"  (1.499 m)   Wt 191 lb (86.6 kg)   LMP 02/19/2019 (Approximate)   BMI 38.58 kg/m  Physical Exam Constitutional:      General: She is not in acute distress.    Appearance: She is well-developed.  Cardiovascular:     Rate and Rhythm: Normal rate.  Pulmonary:     Effort: Pulmonary effort is normal.  Abdominal:     General: There is no distension.     Palpations: Abdomen is soft.     Tenderness: There is no abdominal tenderness.     Comments: Incision Healing Well   Musculoskeletal:        General: Normal range of motion.  Neurological:     Mental Status: She is alert and oriented to person, place, and time.     Cranial Nerves: No cranial nerve deficit.  Skin:    General: Skin is warm and dry.    Assessment: s/p :  total abdominal hysterectomy  stable  Plan: Patient has done well after surgery with no apparent complications.  I have discussed the post-operative course to date, and the expected progress moving forward.  The patient understands what complications to be concerned about.  I will see the patient in routine follow up, or sooner if needed.    Staples removed, steri strips applied Cont IBF and also can use Tylenol for pain    (has not filled or used any narcotics) Activity plan: No heavy lifting.Marland Kitchen  Pelvic rest.  Hoyt Koch 03/04/2019, 11:32 AM

## 2019-04-08 ENCOUNTER — Other Ambulatory Visit: Payer: Self-pay

## 2019-04-08 ENCOUNTER — Ambulatory Visit (INDEPENDENT_AMBULATORY_CARE_PROVIDER_SITE_OTHER): Payer: Medicaid Other | Admitting: Obstetrics & Gynecology

## 2019-04-08 ENCOUNTER — Encounter: Payer: Self-pay | Admitting: Obstetrics & Gynecology

## 2019-04-08 VITALS — BP 120/80 | Ht 59.0 in | Wt 193.0 lb

## 2019-04-08 DIAGNOSIS — Z90710 Acquired absence of both cervix and uterus: Secondary | ICD-10-CM

## 2019-04-08 DIAGNOSIS — Z48816 Encounter for surgical aftercare following surgery on the genitourinary system: Secondary | ICD-10-CM

## 2019-04-08 DIAGNOSIS — D219 Benign neoplasm of connective and other soft tissue, unspecified: Secondary | ICD-10-CM

## 2019-04-08 NOTE — Progress Notes (Signed)
  Postoperative Follow-up Patient presents post op from total abdominal hysterectomy for fibroids, 6 weeks ago.  Subjective: Patient reports marked improvement in her preop symptoms. Eating a regular diet without difficulty. The patient is not having any pain.  Activity: normal activities of daily living. Patient reports additional symptom's since surgery of None.  Objective: BP 120/80   Ht 4\' 11"  (1.499 m)   Wt 193 lb (87.5 kg)   BMI 38.98 kg/m  Physical Exam Constitutional:      General: She is not in acute distress.    Appearance: She is well-developed.  Genitourinary:     Pelvic exam was performed with patient supine.     Vagina and rectum normal.     No vaginal erythema or bleeding.     No right or left adnexal mass present.     Right adnexa not tender.     Left adnexa not tender.     Genitourinary Comments: Cervix and uterus absent. Vaginal cuff healing well.  Cardiovascular:     Rate and Rhythm: Normal rate.  Pulmonary:     Effort: Pulmonary effort is normal.  Abdominal:     General: There is no distension.     Palpations: Abdomen is soft.     Tenderness: There is no abdominal tenderness.     Comments: Incision healing well.  Musculoskeletal:        General: Normal range of motion.  Neurological:     Mental Status: She is alert and oriented to person, place, and time.     Cranial Nerves: No cranial nerve deficit.  Skin:    General: Skin is warm and dry.     Assessment: s/p :  total abdominal hysterectomy progressing well  Plan: Patient has done well after surgery with no apparent complications.  I have discussed the post-operative course to date, and the expected progress moving forward.  The patient understands what complications to be concerned about.  I will see the patient in routine follow up, or sooner if needed.    Activity plan: No restriction.  Hoyt Koch 04/08/2019, 11:22 AM

## 2019-05-27 ENCOUNTER — Other Ambulatory Visit: Payer: Self-pay | Admitting: Family Medicine

## 2019-05-27 DIAGNOSIS — Z1231 Encounter for screening mammogram for malignant neoplasm of breast: Secondary | ICD-10-CM

## 2019-06-18 ENCOUNTER — Ambulatory Visit
Admission: RE | Admit: 2019-06-18 | Discharge: 2019-06-18 | Disposition: A | Discharge status: Home / Self Care | Payer: Medicaid Other | Source: Ambulatory Visit | Attending: Family Medicine | Admitting: Family Medicine

## 2019-06-18 DIAGNOSIS — Z1231 Encounter for screening mammogram for malignant neoplasm of breast: Secondary | ICD-10-CM | POA: Diagnosis present

## 2020-04-12 ENCOUNTER — Ambulatory Visit: Payer: Medicaid Other | Admitting: Obstetrics & Gynecology

## 2020-05-06 ENCOUNTER — Ambulatory Visit (INDEPENDENT_AMBULATORY_CARE_PROVIDER_SITE_OTHER): Payer: Medicaid Other | Admitting: Obstetrics & Gynecology

## 2020-05-06 ENCOUNTER — Other Ambulatory Visit (HOSPITAL_COMMUNITY)
Admission: RE | Admit: 2020-05-06 | Discharge: 2020-05-06 | Disposition: A | Discharge status: Home / Self Care | Payer: Medicaid Other | Source: Ambulatory Visit | Attending: Obstetrics & Gynecology | Admitting: Obstetrics & Gynecology

## 2020-05-06 ENCOUNTER — Encounter: Payer: Self-pay | Admitting: Obstetrics & Gynecology

## 2020-05-06 ENCOUNTER — Other Ambulatory Visit: Payer: Self-pay

## 2020-05-06 VITALS — BP 120/80 | Ht 59.0 in | Wt 201.0 lb

## 2020-05-06 DIAGNOSIS — Z1211 Encounter for screening for malignant neoplasm of colon: Secondary | ICD-10-CM

## 2020-05-06 DIAGNOSIS — Z1321 Encounter for screening for nutritional disorder: Secondary | ICD-10-CM

## 2020-05-06 DIAGNOSIS — Z1231 Encounter for screening mammogram for malignant neoplasm of breast: Secondary | ICD-10-CM

## 2020-05-06 DIAGNOSIS — Z1272 Encounter for screening for malignant neoplasm of vagina: Secondary | ICD-10-CM | POA: Insufficient documentation

## 2020-05-06 DIAGNOSIS — Z1329 Encounter for screening for other suspected endocrine disorder: Secondary | ICD-10-CM

## 2020-05-06 DIAGNOSIS — Z01419 Encounter for gynecological examination (general) (routine) without abnormal findings: Secondary | ICD-10-CM | POA: Diagnosis not present

## 2020-05-06 DIAGNOSIS — Z131 Encounter for screening for diabetes mellitus: Secondary | ICD-10-CM

## 2020-05-06 DIAGNOSIS — Z13220 Encounter for screening for lipoid disorders: Secondary | ICD-10-CM

## 2020-05-06 MED ORDER — REPLENS VA GEL: 1.0000 | VAGINAL | 11 refills | Status: AC

## 2020-05-06 NOTE — Patient Instructions (Addendum)
PAP every 5 years if normal today Mammogram every year, due in late June    Call [phone] to schedule at [facility] Colonoscopy every 5 or 10 years Labs yearly (with PCP)  Thank you for choosing [facility]. As part of our ongoing efforts to improve patient experience, we would appreciate your feedback. Please fill out the short survey that you will receive by mail or MyChart. Your opinion is important to [place]! - Dr. Kenton Kingfisher

## 2020-05-06 NOTE — Progress Notes (Signed)
HPI:      [name] is a 45 y.o. [id number] who LMP was Patient's last menstrual period was 02/19/2019 (approximate)., as she had hysterectomy last year for fibroids,she presents today for her annual examination. The patient has no complaints today other than vaginal burning or dryness at times; no difficulty or pain w sex.  Has occas hot flashes.  The patient is sexually active. Her last pap: pathology from hyst last year showed CIN I; and last mammogram: approximate date 06/2019 and was normal. The patient does perform self breast exams.  There is no notable family history of breast or ovarian cancer in her family.  The patient has regular exercise: yes.  The patient denies current symptoms of depression.    GYN History: Contraception: status post hysterectomy  PMHx: Past Medical History:  Diagnosis Date  . Anemia   . Depression   . GERD (gastroesophageal reflux disease)    on occasion  . Hypertension   . Uterine fibroid    Past Surgical History:  Procedure Laterality Date  . TOTAL LAPAROSCOPIC HYSTERECTOMY WITH SALPINGECTOMY N/A 02/24/2019   Procedure: TOTAL ABDOMINAL HYSTERECTOMY WITH BILATERAL SALPINGECTOMY;  Surgeon: Gae Dry, MD;  Location: [facility] ORS;  Service: Gynecology;  Laterality: N/A;  . TUBAL LIGATION  2009   Family History  Problem Relation Age of Onset  . Cancer Maternal Aunt   . Breast cancer Maternal Aunt    Social History   Tobacco Use  . Smoking status: Never Smoker  . Smokeless tobacco: Never Used  Vaping Use  . Vaping Use: Never used  Substance Use Topics  . Alcohol use: No  . Drug use: No    Current Outpatient Medications:  .  cetirizine (ZYRTEC) 10 MG tablet, Take 10 mg by mouth every morning., Disp: , Rfl:  .  hydrochlorothiazide (MICROZIDE) 12.5 MG capsule, Take 12.5 mg by mouth daily., Disp: , Rfl:  .  ibuprofen (ADVIL) 800 MG tablet, Take 800 mg by mouth every 8 (eight) hours as needed (pain)., Disp: , Rfl:  .  ibuprofen (ADVIL) 800  MG tablet, Take 1 tablet (800 mg total) by mouth every 6 (six) hours., Disp: 30 tablet, Rfl: 0 .  omeprazole (PRILOSEC) 20 MG capsule, Take 20 mg by mouth as needed. , Disp: , Rfl:  .  traMADol (ULTRAM) 50 MG tablet, Take 1 tablet (50 mg total) by mouth every 6 (six) hours as needed for moderate pain., Disp: 20 tablet, Rfl: 0 .  traZODone (DESYREL) 50 MG tablet, Take 50 mg by mouth at bedtime as needed. , Disp: , Rfl:  Allergies: Orange (diagnostic) and Tomato  Review of Systems  Constitutional: Negative for chills, fever and malaise/fatigue.  HENT: Negative for congestion, sinus pain and sore throat.   Eyes: Negative for blurred vision and pain.  Respiratory: Negative for cough and wheezing.   Cardiovascular: Negative for chest pain and leg swelling.  Gastrointestinal: Negative for abdominal pain, constipation, diarrhea, heartburn, nausea and vomiting.  Genitourinary: Negative for dysuria, frequency, hematuria and urgency.  Musculoskeletal: Negative for back pain, joint pain, myalgias and neck pain.  Skin: Negative for itching and rash.  Neurological: Negative for dizziness, tremors and weakness.  Endo/Heme/Allergies: Does not bruise/bleed easily.  Psychiatric/Behavioral: Negative for depression. The patient is not nervous/anxious and does not have insomnia.     Objective: BP 120/80   Ht 4\' 11"  (1.499 m)   Wt 201 lb (91.2 kg)   LMP 02/19/2019 (Approximate)   BMI 40.60 kg/m  Filed Weights   05/06/20 0917  Weight: 201 lb (91.2 kg)   Body mass index is 40.6 kg/m. Physical Exam Constitutional:      General: She is not in acute distress.    Appearance: She is well-developed.  Genitourinary:     Vulva, bladder and rectum normal.     No lesions in the vagina.     Genitourinary Comments: Vaginal cuff well healed     No vaginal bleeding.      Right Adnexa: not tender and no mass present.    Left Adnexa: not tender and no mass present.    Cervix is absent.     Uterus is absent.   Breasts:     Right: No mass, skin change or tenderness.     Left: No mass, skin change or tenderness.    HENT:     Head: Normocephalic and atraumatic. No laceration.     Right Ear: Hearing normal.     Left Ear: Hearing normal.     Mouth/Throat:     Pharynx: Uvula midline.  Eyes:     Pupils: Pupils are equal, round, and reactive to light.  Neck:     Thyroid: No thyromegaly.  Cardiovascular:     Rate and Rhythm: Normal rate and regular rhythm.     Heart sounds: No murmur heard. No friction rub. No gallop.   Pulmonary:     Effort: Pulmonary effort is normal. No respiratory distress.     Breath sounds: Normal breath sounds. No wheezing.  Abdominal:     General: Bowel sounds are normal. There is no distension.     Palpations: Abdomen is soft.     Tenderness: There is no abdominal tenderness. There is no rebound.  Musculoskeletal:        General: Normal range of motion.     Cervical back: Normal range of motion and neck supple.  Neurological:     Mental Status: She is alert and oriented to person, place, and time.     Cranial Nerves: No cranial nerve deficit.  Skin:    General: Skin is warm and dry.  Psychiatric:        Judgment: Judgment normal.  Vitals reviewed.     Assessment:  ANNUAL EXAM 1. Women's annual routine gynecological examination   2. Screening for vaginal cancer   3. Encounter for screening mammogram for malignant neoplasm of breast   4. Screen for colon cancer      Screening Plan:            1.  Vaginal Screening as she had abnormal pathology on hysterectomy specimen last year (to rule out residual cervical tissue or dysplasia left behind)-  Pap smear done today, Pap smear schedule reviewed with patient, every 5 years (vag) if normal today  2. Breast screening- Exam annually and mammogram>40 planned   3. Colonoscopy every 10 years, Hemoccult testing - after age 60  4. Labs Ordered today   5. Vag atrophy, plan Replens Alternatives discussed May  be peri-menopausal w other sx's, monitor and treat as necessary     F/U  Return in about 1 year (around 05/06/2021) for Annual.  Barnett Applebaum, MD, [facility], [facility] 05/06/2020  9:24 AM

## 2020-05-07 LAB — TSH: TSH: 2.82 u[IU]/mL (ref 0.450–4.500)

## 2020-05-07 LAB — LIPID PANEL
Chol/HDL Ratio: 3.9 ratio (ref 0.0–4.4)
Cholesterol, Total: 201 mg/dL — ABNORMAL HIGH (ref 100–199)
HDL: 51 mg/dL (ref >39)
LDL Chol Calc (NIH): 126 mg/dL — ABNORMAL HIGH (ref 0–99)
Triglycerides: 133 mg/dL (ref 0–149)
VLDL Cholesterol Cal: 24 mg/dL (ref 5–40)

## 2020-05-07 LAB — VITAMIN D 25 HYDROXY (VIT D DEFICIENCY, FRACTURES): Vit D, 25-Hydroxy: 11.7 ng/mL — ABNORMAL LOW (ref 30.0–100.0)

## 2020-05-07 LAB — GLUCOSE, FASTING: Glucose, Plasma: 129 mg/dL — ABNORMAL HIGH (ref 65–99)

## 2020-05-11 LAB — CYTOLOGY - PAP: Diagnosis: NEGATIVE

## 2020-06-20 ENCOUNTER — Ambulatory Visit
Admission: RE | Admit: 2020-06-20 | Discharge: 2020-06-20 | Disposition: A | Discharge status: Home / Self Care | Payer: Medicaid Other | Source: Ambulatory Visit | Attending: Obstetrics & Gynecology | Admitting: Obstetrics & Gynecology

## 2020-06-20 ENCOUNTER — Other Ambulatory Visit: Payer: Self-pay

## 2020-06-20 DIAGNOSIS — Z1231 Encounter for screening mammogram for malignant neoplasm of breast: Secondary | ICD-10-CM | POA: Diagnosis present

## 2020-06-24 ENCOUNTER — Telehealth: Payer: Medicaid Other

## 2020-07-25 ENCOUNTER — Telehealth (INDEPENDENT_AMBULATORY_CARE_PROVIDER_SITE_OTHER): Payer: Medicaid Other | Admitting: Gastroenterology

## 2020-07-25 DIAGNOSIS — Z1211 Encounter for screening for malignant neoplasm of colon: Secondary | ICD-10-CM

## 2020-07-25 MED ORDER — PEG 3350-KCL-NA BICARB-NACL 420 G PO SOLR: 4000.0000 mL | Freq: Once | ORAL | 0 refills | Status: AC

## 2020-07-25 NOTE — Progress Notes (Signed)
Gastroenterology Pre-Procedure Review  Request Date: 08/16/20 Requesting Physician: Dr. Vicente Males  PATIENT REVIEW QUESTIONS: The patient responded to the following health history questions as indicated:    1. Are you having any GI issues? no 2. Do you have a personal history of Polyps? no 3. Do you have a family history of Colon Cancer or Polyps? no 4. Diabetes Mellitus? no 5. Joint replacements in the past 12 months?no 6. Major health problems in the past 3 months?no 7. Any artificial heart valves, MVP, or defibrillator?no    MEDICATIONS & ALLERGIES:    Patient reports the following regarding taking any anticoagulation/antiplatelet therapy:   Plavix, Coumadin, Eliquis, Xarelto, Lovenox, Pradaxa, Brilinta, or Effient? no Aspirin? no  Patient confirms/reports the following medications:  Current Outpatient Medications  Medication Sig Dispense Refill   cetirizine (ZYRTEC) 10 MG tablet Take 10 mg by mouth every morning.     hydrochlorothiazide (MICROZIDE) 12.5 MG capsule Take 12.5 mg by mouth daily.     ibuprofen (ADVIL) 800 MG tablet Take 800 mg by mouth every 8 (eight) hours as needed (pain).     ibuprofen (ADVIL) 800 MG tablet Take 1 tablet (800 mg total) by mouth every 6 (six) hours. 30 tablet 0   omeprazole (PRILOSEC) 20 MG capsule Take 20 mg by mouth as needed.      traMADol (ULTRAM) 50 MG tablet Take 1 tablet (50 mg total) by mouth every 6 (six) hours as needed for moderate pain. 20 tablet 0   traZODone (DESYREL) 50 MG tablet Take 50 mg by mouth at bedtime as needed.      Vaginal Lubricant (REPLENS) GEL Place 1 Applicatorful vaginally 2 (two) times a week. 35 g 11   No current facility-administered medications for this visit.    Patient confirms/reports the following allergies:  Allergies  Allergen Reactions   Orange (Diagnostic) Itching   Tomato Itching    No orders of the defined types were placed in this encounter.   AUTHORIZATION INFORMATION Primary  Insurance: 1D#: Group #:  Secondary Insurance: 1D#: Group #:  SCHEDULE INFORMATION: Date: 08/16/20 Time: Location: [place]

## 2020-08-15 ENCOUNTER — Telehealth: Payer: Self-pay | Admitting: Gastroenterology

## 2020-08-15 ENCOUNTER — Other Ambulatory Visit: Payer: Self-pay

## 2020-08-15 DIAGNOSIS — Z1211 Encounter for screening for malignant neoplasm of colon: Secondary | ICD-10-CM

## 2020-08-15 NOTE — Telephone Encounter (Signed)
Patient was contacted and she agreed on getting her colonoscopy on 09/08/2020. I also stated to her that I would be sending her new instructions with the dates changed. Patient was also instructed on how to take her prep. Patient understood and had no further questions.

## 2020-08-15 NOTE — Telephone Encounter (Signed)
Patient wants to reschedule procedure date.

## 2020-08-16 ENCOUNTER — Encounter: Admission: RE | Payer: Self-pay | Source: Home / Self Care

## 2020-08-16 ENCOUNTER — Ambulatory Visit: Admission: RE | Admit: 2020-08-16 | Payer: Medicaid Other | Source: Home / Self Care | Admitting: Gastroenterology

## 2020-08-16 SURGERY — COLONOSCOPY WITH PROPOFOL
Anesthesia: General

## 2020-09-07 ENCOUNTER — Encounter: Payer: Self-pay | Admitting: Gastroenterology

## 2020-11-10 ENCOUNTER — Encounter: Payer: Self-pay | Admitting: Gastroenterology

## 2020-11-11 ENCOUNTER — Ambulatory Visit: Payer: Medicaid Other | Admitting: Registered Nurse

## 2020-11-11 ENCOUNTER — Encounter: Payer: Self-pay | Admitting: Gastroenterology

## 2020-11-11 ENCOUNTER — Encounter
Admission: RE | Disposition: A | Discharge status: Home / Self Care | Payer: Self-pay | Source: Home / Self Care | Attending: Gastroenterology

## 2020-11-11 ENCOUNTER — Ambulatory Visit
Admission: RE | Admit: 2020-11-11 | Discharge: 2020-11-11 | Disposition: A | Discharge status: Home / Self Care | Payer: Medicaid Other | Attending: Gastroenterology | Admitting: Gastroenterology

## 2020-11-11 DIAGNOSIS — Z7984 Long term (current) use of oral hypoglycemic drugs: Secondary | ICD-10-CM | POA: Insufficient documentation

## 2020-11-11 DIAGNOSIS — Z79899 Other long term (current) drug therapy: Secondary | ICD-10-CM | POA: Diagnosis not present

## 2020-11-11 DIAGNOSIS — Z1211 Encounter for screening for malignant neoplasm of colon: Secondary | ICD-10-CM | POA: Insufficient documentation

## 2020-11-11 DIAGNOSIS — K219 Gastro-esophageal reflux disease without esophagitis: Secondary | ICD-10-CM | POA: Diagnosis not present

## 2020-11-11 DIAGNOSIS — F32A Depression, unspecified: Secondary | ICD-10-CM | POA: Insufficient documentation

## 2020-11-11 DIAGNOSIS — I10 Essential (primary) hypertension: Secondary | ICD-10-CM | POA: Insufficient documentation

## 2020-11-11 DIAGNOSIS — E119 Type 2 diabetes mellitus without complications: Secondary | ICD-10-CM | POA: Insufficient documentation

## 2020-11-11 HISTORY — PX: COLONOSCOPY WITH PROPOFOL: SHX5780

## 2020-11-11 LAB — GLUCOSE, CAPILLARY: Glucose-Capillary: 89 mg/dL (ref 70–99)

## 2020-11-11 SURGERY — COLONOSCOPY WITH PROPOFOL
Anesthesia: General

## 2020-11-11 MED ORDER — PROPOFOL 10 MG/ML IV BOLUS
INTRAVENOUS | Status: DC | PRN
  Administered 2020-11-11 (×2): 20 mg via INTRAVENOUS
  Administered 2020-11-11: 60 mg via INTRAVENOUS

## 2020-11-11 MED ORDER — SODIUM CHLORIDE 0.9 % IV SOLN
INTRAVENOUS | Status: DC
  Administered 2020-11-11: 1000 mL via INTRAVENOUS

## 2020-11-11 MED ORDER — PROPOFOL 500 MG/50ML IV EMUL
INTRAVENOUS | Status: DC | PRN
  Administered 2020-11-11: 125 ug/kg/min via INTRAVENOUS

## 2020-11-11 MED ORDER — LIDOCAINE HCL (CARDIAC) PF 100 MG/5ML IV SOSY
PREFILLED_SYRINGE | INTRAVENOUS | Status: DC | PRN
Start: 2020-11-11 — End: 2020-11-11
  Administered 2020-11-11: 50 mg via INTRAVENOUS

## 2020-11-11 NOTE — Op Note (Signed)
[facility] Gastroenterology Patient Name: Christie Alexander Procedure Date: 11/11/2020 8:56 AM MRN: [id number] Account #: [id number] Date of Birth: 1975-07-24 Admit Type: Outpatient Age: 45 Room: [facility] ENDO ROOM 4 Gender: Female Note Status: Finalized Instrument Name: Park Meo [id number] Procedure:             Colonoscopy Indications:           Screening for colorectal malignant neoplasm Providers:             Jonathon Bellows MD, MD Medicines:             Monitored Anesthesia Care Complications:         No immediate complications. Procedure:             Pre-Anesthesia Assessment:                        - Prior to the procedure, a History and Physical was                         performed, and patient medications, allergies and                         sensitivities were reviewed. The patient's tolerance                         of previous anesthesia was reviewed.                        - The risks and benefits of the procedure and the                         sedation options and risks were discussed with the                         patient. All questions were answered and informed                         consent was obtained.                        - ASA Grade Assessment: II - A patient with mild                         systemic disease.                        After obtaining informed consent, the colonoscope was                         passed under direct vision. Throughout the procedure,                         the patient's blood pressure, pulse, and oxygen                         saturations were monitored continuously. The                         Colonoscope was introduced through the anus and  advanced to the the cecum, identified by the                         appendiceal orifice. The colonoscopy was performed                         with ease. The patient tolerated the procedure well.                         The quality of the bowel preparation  was good. Findings:      The perianal and digital rectal examinations were normal.      The entire examined colon appeared normal on direct and retroflexion       views. Impression:            - The entire examined colon is normal on direct and                         retroflexion views.                        - No specimens collected. Recommendation:        - Discharge patient to home (with escort).                        - Resume previous diet.                        - Continue present medications.                        - Repeat colonoscopy in 10 years for screening                         purposes. Procedure Code(s):     --- Professional ---                        [phone], Colonoscopy, flexible; diagnostic, including                         collection of specimen(s) by brushing or washing, when                         performed (separate procedure) Diagnosis Code(s):     --- Professional ---                        Z12.11, Encounter for screening for malignant neoplasm                         of colon CPT copyright 2019 American Medical Association. All rights reserved. The codes documented in this report are preliminary and upon coder review may  be revised to meet current compliance requirements. Jonathon Bellows, MD Jonathon Bellows MD, MD 11/11/2020 9:25:53 AM This report has been signed electronically. Number of Addenda: 0 Note Initiated On: 11/11/2020 8:56 AM Scope Withdrawal Time: 0 hours 7 minutes 47 seconds  Total Procedure Duration: 0 hours 10 minutes 42 seconds  Estimated Blood Loss:  Estimated blood loss: none.      [facility]

## 2020-11-11 NOTE — Transfer of Care (Signed)
Immediate Anesthesia Transfer of Care Note  Patient: Christie Alexander  Procedure(s) Performed: COLONOSCOPY WITH PROPOFOL  Patient Location: Endoscopy Unit  Anesthesia Type:General  Level of Consciousness: awake, alert  and oriented  Airway & Oxygen Therapy: Patient Spontanous Breathing and Patient connected to nasal cannula oxygen  Post-op Assessment: Report given to RN and Post -op Vital signs reviewed and stable  Post vital signs: Reviewed and stable  Last Vitals:  Vitals Value Taken Time  BP 139/93 11/11/20 0930  Temp    Pulse 104 11/11/20 0930  Resp 16 11/11/20 0930  SpO2 98 % 11/11/20 0930    Last Pain:  Vitals:   11/11/20 0928  TempSrc:   PainSc: 0-No pain       Pt calm and A&O. She states that she sometimes becomes tachycardic. Denies pain or discomfort.   Complications: No notable events documented.

## 2020-11-11 NOTE — Anesthesia Postprocedure Evaluation (Signed)
Anesthesia Post Note  Patient: Christie Alexander  Procedure(s) Performed: COLONOSCOPY WITH PROPOFOL  Patient location during evaluation: PACU Anesthesia Type: General Level of consciousness: awake and awake and alert Pain management: pain level controlled Vital Signs Assessment: post-procedure vital signs reviewed and stable Respiratory status: spontaneous breathing and nonlabored ventilation Cardiovascular status: blood pressure returned to baseline Anesthetic complications: no   No notable events documented.   Last Vitals:  Vitals:   11/11/20 0930 11/11/20 0946  BP: (!) 139/93   Pulse: (!) 104   Resp: 16   Temp:  (!) 35.9 C  SpO2: 98%     Last Pain:  Vitals:   11/11/20 0946  TempSrc:   PainSc: 0-No pain                 VAN STAVEREN,Kento Gossman

## 2020-11-11 NOTE — H&P (Signed)
Jonathon Bellows, MD [address], [place], [place], [place], [address] [address], [place], [place], [place], [address] Phone: [phone]  Fax: [phone]  Primary Care Physician:  Denton Lank, MD   Pre-Procedure History & Physical: HPI:  CORNELIA Alexander is a 45 y.o. female is here for an colonoscopy.   Past Medical History:  Diagnosis Date   Anemia    Depression    GERD (gastroesophageal reflux disease)    on occasion   Hypertension    Uterine fibroid     Past Surgical History:  Procedure Laterality Date   ABDOMINAL HYSTERECTOMY     TOTAL LAPAROSCOPIC HYSTERECTOMY WITH SALPINGECTOMY N/A 02/24/2019   Procedure: TOTAL ABDOMINAL HYSTERECTOMY WITH BILATERAL SALPINGECTOMY;  Surgeon: Gae Dry, MD;  Location: [facility];  Service: Gynecology;  Laterality: N/A;   TUBAL LIGATION  2009    Prior to Admission medications   Medication Sig Start Date End Date Taking? Authorizing Provider  atorvastatin (LIPITOR) 80 MG tablet Take 80 mg by mouth at bedtime. 07/18/20  Yes [provider]  cetirizine (ZYRTEC) 10 MG tablet Take 10 mg by mouth every morning.   Yes [provider]  hydrochlorothiazide (MICROZIDE) 12.5 MG capsule Take 12.5 mg by mouth daily. 02/02/19  Yes [provider]  omeprazole (PRILOSEC) 20 MG capsule Take 20 mg by mouth as needed.   Yes [provider]  Semaglutide (OZEMPIC, 0.25 OR 0.5 MG/DOSE, Mona) Inject 0.5 mg into the skin.   Yes [provider]  ibuprofen (ADVIL) 800 MG tablet Take 800 mg by mouth every 8 (eight) hours as needed (pain).    [provider]  ibuprofen (ADVIL) 800 MG tablet Take 1 tablet (800 mg total) by mouth every 6 (six) hours. 02/26/19   Gae Dry, MD  metFORMIN (GLUCOPHAGE) 500 MG tablet Take 500 mg by mouth 2 (two) times daily. Patient not taking: Reported on 11/11/2020 05/12/20   [provider]  traMADol (ULTRAM) 50 MG tablet Take 1 tablet (50 mg total) by mouth  every 6 (six) hours as needed for moderate pain. Patient not taking: Reported on 07/25/2020 02/26/19   Gae Dry, MD  traZODone (DESYREL) 50 MG tablet Take 50 mg by mouth at bedtime as needed.  Patient not taking: Reported on 07/25/2020 10/23/18   [provider]  Vaginal Lubricant (REPLENS) GEL Place 1 Applicatorful vaginally 2 (two) times a week. 05/09/20   Gae Dry, MD    Allergies as of 08/16/2020 - Review Complete 07/25/2020  Allergen Reaction Noted   Orange (diagnostic) Itching 10/26/2015   Tomato Itching 10/26/2015    Family History  Problem Relation Age of Onset   Cancer Maternal Aunt    Breast cancer Maternal Aunt     Social History   Socioeconomic History   Marital status: Single    Spouse name: Not on file   Number of children: Not on file   Years of education: Not on file   Highest education level: Not on file  Occupational History   Not on file  Tobacco Use   Smoking status: Never   Smokeless tobacco: Never  Vaping Use   Vaping Use: Never used  Substance and Sexual Activity   Alcohol use: No   Drug use: No   Sexual activity: Yes    Birth control/protection: Surgical  Other Topics Concern   Not on file  Social History Narrative   Not on file   Social Determinants of Health  Financial Resource Strain: Not on file  Food Insecurity: Not on file  Transportation Needs: Not on file  Physical Activity: Not on file  Stress: Not on file  Social Connections: Not on file  Intimate Partner Violence: Not on file    Review of Systems: See HPI, otherwise negative ROS  Physical Exam: BP (!) 148/93   Pulse 95   Temp (!) 97.1 F (36.2 C) (Temporal)   Resp 19   Ht 4\' 11"  (1.499 m)   Wt 81.6 kg   LMP 02/19/2019 (Approximate)   SpO2 99%   BMI 36.36 kg/m  General:   Alert,  pleasant and cooperative in NAD Head:  Normocephalic and atraumatic. Neck:  Supple; no masses or thyromegaly. Lungs:  Clear throughout to auscultation, normal  respiratory effort.    Heart:  +S1, +S2, Regular rate and rhythm, No edema. Abdomen:  Soft, nontender and nondistended. Normal bowel sounds, without guarding, and without rebound.   Neurologic:  Alert and  oriented x4;  grossly normal neurologically.  Impression/Plan: Christie Alexander is here for an colonoscopy to be performed for Screening colonoscopy average risk   Risks, benefits, limitations, and alternatives regarding  colonoscopy have been reviewed with the patient.  Questions have been answered.  All parties agreeable.   Jonathon Bellows, MD  11/11/2020, 9:01 AM

## 2020-11-11 NOTE — Anesthesia Preprocedure Evaluation (Signed)
Anesthesia Evaluation  Patient identified by MRN, date of birth, ID band Patient awake    Reviewed: Allergy & Precautions, NPO status , Patient's Chart, lab work & pertinent test results  Airway Mallampati: II  TM Distance: >3 FB Neck ROM: full    Dental  (+) Teeth Intact   Pulmonary neg pulmonary ROS,    Pulmonary exam normal        Cardiovascular Exercise Tolerance: Good hypertension, Pt. on medications negative cardio ROS Normal cardiovascular exam Rhythm:Regular Rate:Normal     Neuro/Psych Depression negative neurological ROS  negative psych ROS   GI/Hepatic negative GI ROS, Neg liver ROS, GERD  ,  Endo/Other  negative endocrine ROSdiabetes, Oral Hypoglycemic Agents  Renal/GU negative Renal ROS  negative genitourinary   Musculoskeletal   Abdominal Normal abdominal exam  (+)   Peds negative pediatric ROS (+)  Hematology negative hematology ROS (+) Blood dyscrasia, anemia ,   Anesthesia Other Findings Past Medical History: No date: Anemia No date: Depression No date: GERD (gastroesophageal reflux disease)     Comment:  on occasion No date: Hypertension No date: Uterine fibroid  Past Surgical History: No date: ABDOMINAL HYSTERECTOMY 02/24/2019: TOTAL LAPAROSCOPIC HYSTERECTOMY WITH SALPINGECTOMY; N/A     Comment:  Procedure: TOTAL ABDOMINAL HYSTERECTOMY WITH BILATERAL               SALPINGECTOMY;  Surgeon: Gae Dry, MD;  Location:              [facility];  Service: Gynecology;  Laterality: N/A; 2009: TUBAL LIGATION  BMI    Body Mass Index: 36.36 kg/m      Reproductive/Obstetrics negative OB ROS                             Anesthesia Physical Anesthesia Plan  ASA: 2  Anesthesia Plan: General   Post-op Pain Management:    Induction: Intravenous  PONV Risk Score and Plan: Propofol infusion and TIVA  Airway Management Planned: Nasal Cannula  Additional  Equipment:   Intra-op Plan:   Post-operative Plan:   Informed Consent: I have reviewed the patients History and Physical, chart, labs and discussed the procedure including the risks, benefits and alternatives for the proposed anesthesia with the patient or authorized representative who has indicated his/her understanding and acceptance.     Dental Advisory Given  Plan Discussed with: CRNA and Surgeon  Anesthesia Plan Comments:         Anesthesia Quick Evaluation

## 2020-11-15 ENCOUNTER — Encounter: Payer: Self-pay | Admitting: Gastroenterology

## 2021-04-27 ENCOUNTER — Telehealth: Payer: Self-pay | Admitting: Family Medicine

## 2021-04-27 NOTE — Telephone Encounter (Signed)
Left message for pt to call office to make appt for annual in May 2023 ?

## 2021-05-31 ENCOUNTER — Ambulatory Visit: Payer: Medicaid Other | Admitting: Obstetrics and Gynecology

## 2021-06-01 ENCOUNTER — Ambulatory Visit: Payer: Medicaid Other | Admitting: Obstetrics and Gynecology

## 2021-06-26 ENCOUNTER — Ambulatory Visit (INDEPENDENT_AMBULATORY_CARE_PROVIDER_SITE_OTHER): Payer: Medicaid Other | Admitting: Family Medicine

## 2021-06-26 ENCOUNTER — Encounter: Payer: Self-pay | Admitting: Family Medicine

## 2021-06-26 VITALS — BP 128/62 | Ht 59.0 in | Wt 183.0 lb

## 2021-06-26 DIAGNOSIS — N951 Menopausal and female climacteric states: Secondary | ICD-10-CM | POA: Diagnosis not present

## 2021-06-26 DIAGNOSIS — Z01419 Encounter for gynecological examination (general) (routine) without abnormal findings: Secondary | ICD-10-CM

## 2021-06-26 DIAGNOSIS — Z90710 Acquired absence of both cervix and uterus: Secondary | ICD-10-CM | POA: Diagnosis not present

## 2021-06-26 DIAGNOSIS — Z1231 Encounter for screening mammogram for malignant neoplasm of breast: Secondary | ICD-10-CM

## 2021-06-26 MED ORDER — VENLAFAXINE HCL ER 37.5 MG PO CP24: 37.5000 mg | ORAL_CAPSULE | Freq: Every day | ORAL | 12 refills | Status: AC

## 2021-08-03 ENCOUNTER — Ambulatory Visit
Admission: RE | Admit: 2021-08-03 | Discharge: 2021-08-03 | Disposition: A | Discharge status: Home / Self Care | Payer: Medicaid Other | Source: Ambulatory Visit | Attending: Family Medicine | Admitting: Family Medicine

## 2021-08-03 DIAGNOSIS — Z1231 Encounter for screening mammogram for malignant neoplasm of breast: Secondary | ICD-10-CM | POA: Diagnosis present

## 2021-08-03 DIAGNOSIS — Z01419 Encounter for gynecological examination (general) (routine) without abnormal findings: Secondary | ICD-10-CM

## 2021-08-04 ENCOUNTER — Other Ambulatory Visit: Payer: Self-pay | Admitting: Family Medicine

## 2021-08-04 DIAGNOSIS — R928 Other abnormal and inconclusive findings on diagnostic imaging of breast: Secondary | ICD-10-CM

## 2021-08-04 DIAGNOSIS — N6489 Other specified disorders of breast: Secondary | ICD-10-CM

## 2021-08-24 ENCOUNTER — Ambulatory Visit
Admission: RE | Admit: 2021-08-24 | Discharge: 2021-08-24 | Disposition: A | Discharge status: Home / Self Care | Payer: Medicaid Other | Source: Ambulatory Visit | Attending: Family Medicine | Admitting: Family Medicine

## 2021-08-24 DIAGNOSIS — N6489 Other specified disorders of breast: Secondary | ICD-10-CM | POA: Diagnosis present

## 2021-08-24 DIAGNOSIS — R928 Other abnormal and inconclusive findings on diagnostic imaging of breast: Secondary | ICD-10-CM | POA: Diagnosis present

## 2022-07-11 ENCOUNTER — Other Ambulatory Visit: Payer: Self-pay | Admitting: Family Medicine

## 2022-07-11 DIAGNOSIS — Z1231 Encounter for screening mammogram for malignant neoplasm of breast: Secondary | ICD-10-CM

## 2022-08-07 ENCOUNTER — Ambulatory Visit
Admission: RE | Admit: 2022-08-07 | Discharge: 2022-08-07 | Disposition: A | Discharge status: Home / Self Care | Payer: Medicaid Other | Source: Ambulatory Visit | Attending: Family Medicine | Admitting: Family Medicine

## 2022-08-07 DIAGNOSIS — Z1231 Encounter for screening mammogram for malignant neoplasm of breast: Secondary | ICD-10-CM | POA: Diagnosis present

## 2022-12-23 IMAGING — MG MM DIGITAL SCREENING BILAT W/ TOMO AND CAD
8 series · 8 of 24 positions shown · non-contrast
Comparison: Previous exam(s).

CLINICAL DATA: Screening.

EXAM:
DIGITAL SCREENING BILATERAL MAMMOGRAM WITH TOMOSYNTHESIS AND CAD
TECHNIQUE: Bilateral screening digital craniocaudal and mediolateral oblique
mammograms were obtained. Bilateral screening digital breast
tomosynthesis was performed. The images were evaluated with
computer-aided detection.

[R MLO synth-2D]
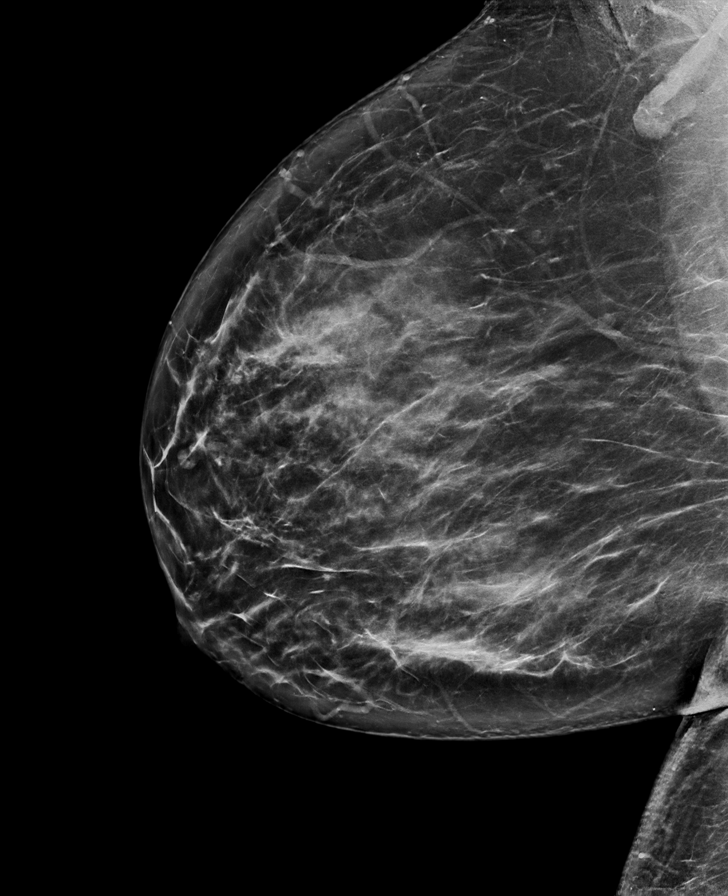

[R CC synth-2D]
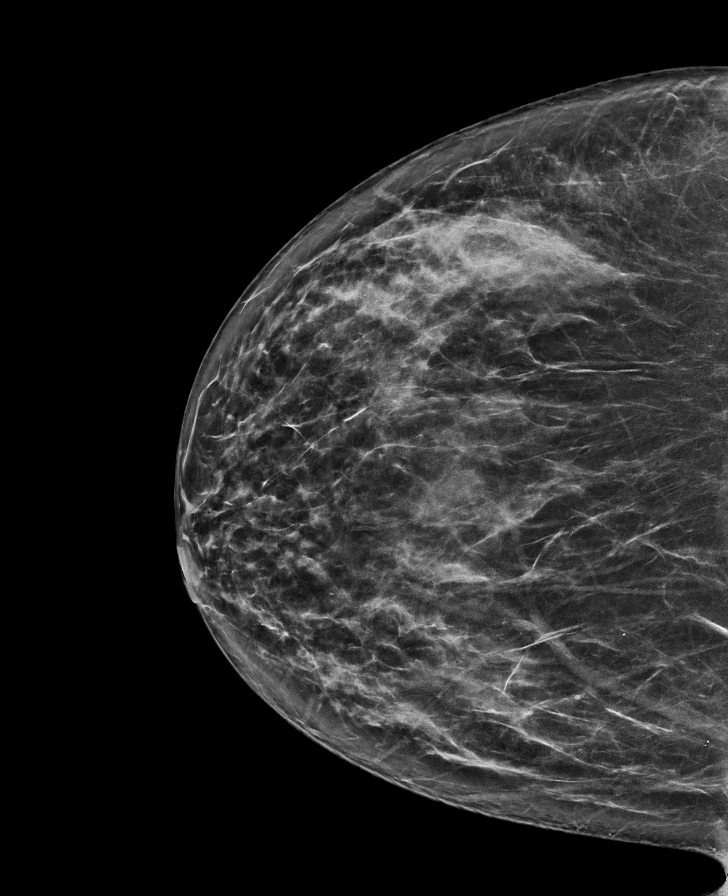

[L MLO synth-2D]
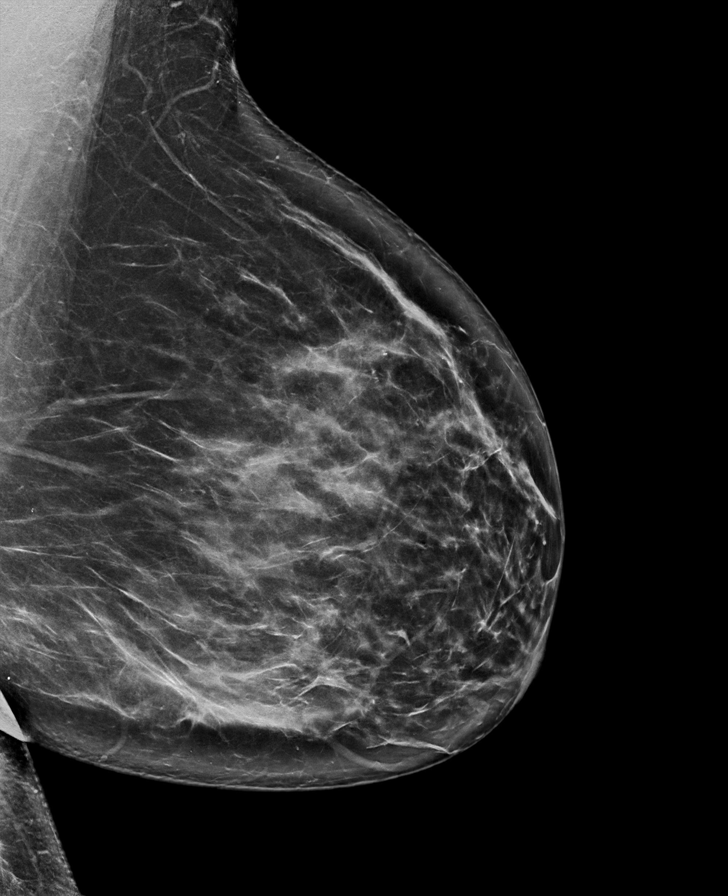

[L CC synth-2D]
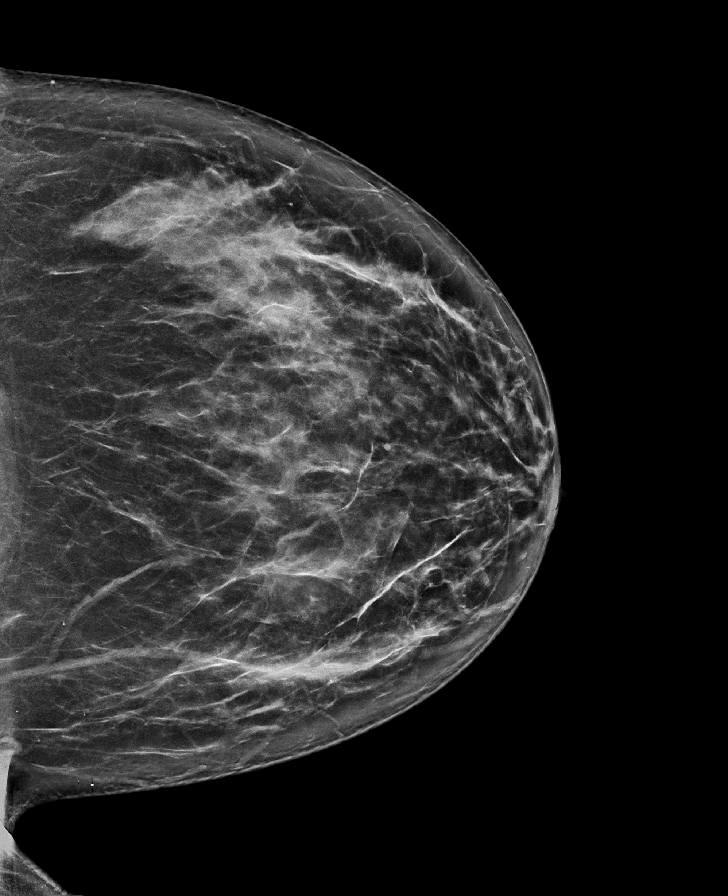

[R CC tomo · tomo slice 42/83.0]
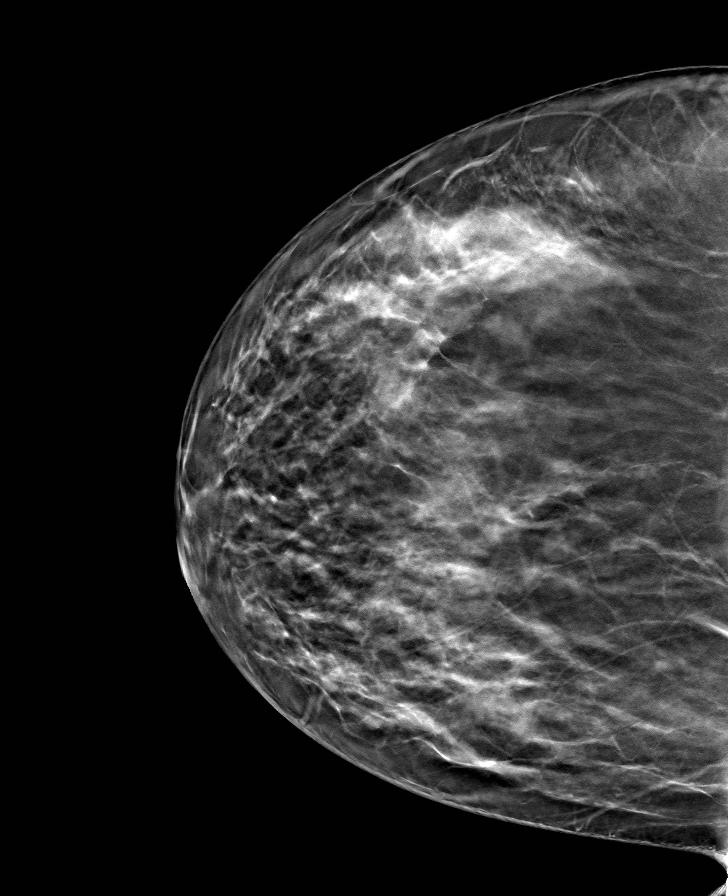

[L MLO tomo · tomo slice 45/89.0]
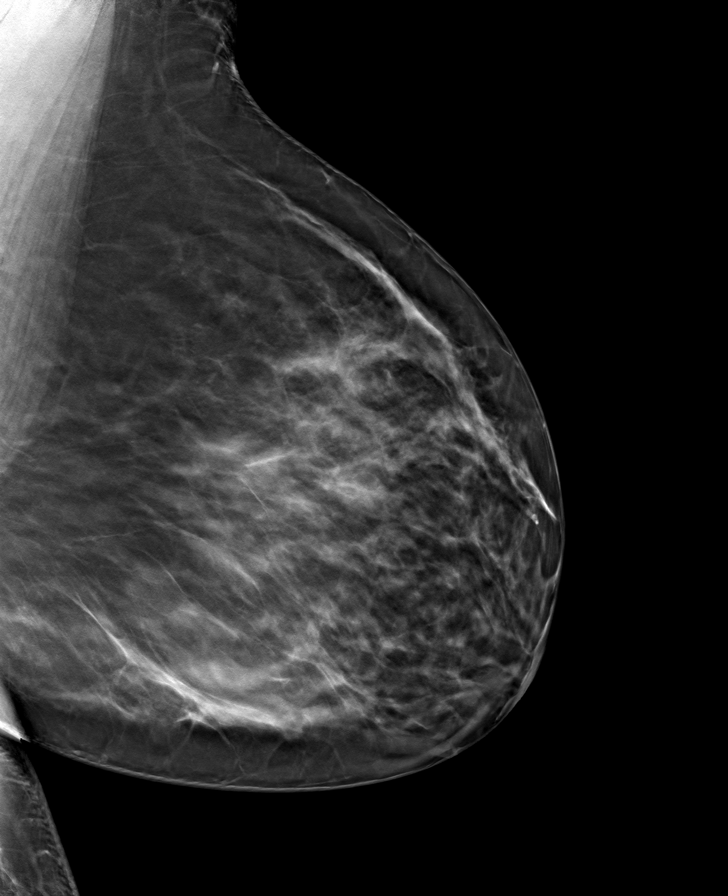

[L CC tomo · tomo slice 43/85.0]
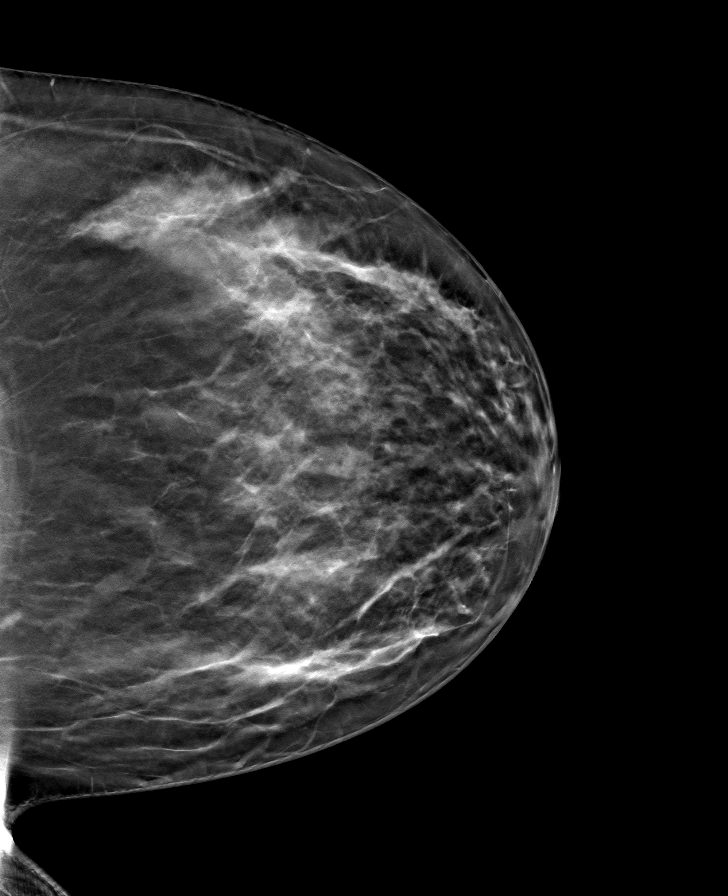

[R MLO tomo · tomo slice 45/89.0]
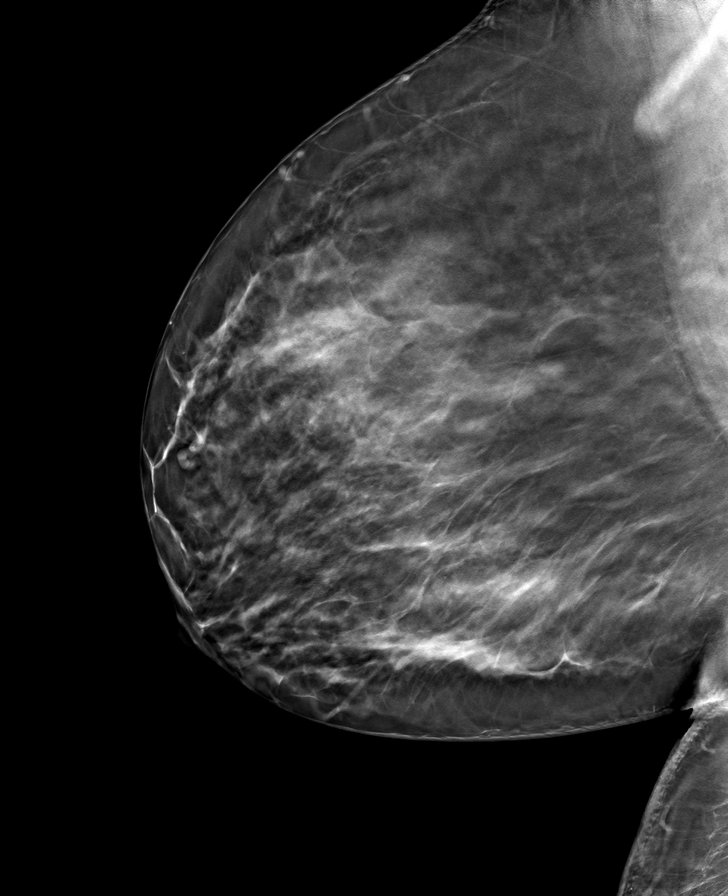

[8 of 24 positions shown; findings below may reference images not displayed]

ACR Breast Density Category c: The breast tissue is heterogeneously
dense, which may obscure small masses.
FINDINGS: There are no findings suspicious for malignancy.
IMPRESSION: No mammographic evidence of malignancy. A result letter of this
screening mammogram will be mailed directly to the patient.

RECOMMENDATION:
Screening mammogram in one year. (Code:Q3-W-BC3)

BI-RADS CATEGORY  1: Negative.

## 2023-06-25 ENCOUNTER — Other Ambulatory Visit: Payer: Self-pay | Admitting: Family Medicine

## 2023-06-25 DIAGNOSIS — Z1231 Encounter for screening mammogram for malignant neoplasm of breast: Secondary | ICD-10-CM

## 2023-07-29 ENCOUNTER — Other Ambulatory Visit: Payer: Self-pay | Admitting: Medical Genetics

## 2023-08-08 ENCOUNTER — Encounter

## 2023-09-05 ENCOUNTER — Encounter

## 2023-10-07 ENCOUNTER — Ambulatory Visit
Admission: RE | Admit: 2023-10-07 | Discharge: 2023-10-07 | Disposition: A | Discharge status: Home / Self Care | Source: Ambulatory Visit | Attending: Family Medicine | Admitting: Family Medicine

## 2023-10-07 DIAGNOSIS — Z1231 Encounter for screening mammogram for malignant neoplasm of breast: Secondary | ICD-10-CM | POA: Insufficient documentation

## 2023-10-22 ENCOUNTER — Other Ambulatory Visit: Payer: Self-pay | Admitting: Medical Genetics

## 2023-10-22 DIAGNOSIS — Z006 Encounter for examination for normal comparison and control in clinical research program: Secondary | ICD-10-CM

## 2023-11-25 LAB — GENECONNECT MOLECULAR SCREEN: Genetic Analysis Overall Interpretation: NEGATIVE
